# Patient Record
Sex: Female | Born: 1980
Health system: Southern US, Community
[De-identification: ages and names within clinical notes are randomized; demographics above are authoritative.]

## PROBLEM LIST (undated history)

## (undated) DIAGNOSIS — I10 Essential (primary) hypertension: Secondary | ICD-10-CM

## (undated) DIAGNOSIS — M1990 Unspecified osteoarthritis, unspecified site: Secondary | ICD-10-CM

## (undated) DIAGNOSIS — D219 Benign neoplasm of connective and other soft tissue, unspecified: Secondary | ICD-10-CM

## (undated) DIAGNOSIS — A4902 Methicillin resistant Staphylococcus aureus infection, unspecified site: Secondary | ICD-10-CM

## (undated) DIAGNOSIS — E119 Type 2 diabetes mellitus without complications: Secondary | ICD-10-CM

## (undated) HISTORY — PX: TONSILLECTOMY: SUR1361

---

## 2008-04-07 ENCOUNTER — Emergency Department (HOSPITAL_COMMUNITY): Admission: EM | Admit: 2008-04-07 | Discharge: 2008-04-07 | Payer: Self-pay | Admitting: Emergency Medicine

## 2008-06-15 ENCOUNTER — Emergency Department (HOSPITAL_COMMUNITY): Admission: EM | Admit: 2008-06-15 | Discharge: 2008-06-16 | Payer: Self-pay | Admitting: Emergency Medicine

## 2008-10-01 ENCOUNTER — Emergency Department (HOSPITAL_COMMUNITY): Admission: EM | Admit: 2008-10-01 | Discharge: 2008-10-01 | Payer: Self-pay | Admitting: Family Medicine

## 2010-04-01 ENCOUNTER — Emergency Department (HOSPITAL_COMMUNITY): Admission: EM | Admit: 2010-04-01 | Discharge: 2010-04-02 | Payer: Self-pay | Admitting: Emergency Medicine

## 2010-04-14 ENCOUNTER — Emergency Department (HOSPITAL_COMMUNITY): Admission: EM | Admit: 2010-04-14 | Discharge: 2010-04-14 | Payer: Self-pay | Admitting: Emergency Medicine

## 2010-09-28 LAB — WOUND CULTURE

## 2011-03-16 LAB — WOUND CULTURE

## 2011-06-01 ENCOUNTER — Ambulatory Visit: Payer: Self-pay

## 2011-10-23 ENCOUNTER — Emergency Department (HOSPITAL_BASED_OUTPATIENT_CLINIC_OR_DEPARTMENT_OTHER)
Admission: EM | Admit: 2011-10-23 | Discharge: 2011-10-23 | Disposition: A | Discharge status: Home / Self Care | Payer: Self-pay | Attending: Emergency Medicine | Admitting: Emergency Medicine

## 2011-10-23 ENCOUNTER — Encounter (HOSPITAL_BASED_OUTPATIENT_CLINIC_OR_DEPARTMENT_OTHER): Payer: Self-pay | Admitting: *Deleted

## 2011-10-23 DIAGNOSIS — R21 Rash and other nonspecific skin eruption: Secondary | ICD-10-CM | POA: Insufficient documentation

## 2011-10-23 DIAGNOSIS — L089 Local infection of the skin and subcutaneous tissue, unspecified: Secondary | ICD-10-CM | POA: Insufficient documentation

## 2011-10-23 HISTORY — DX: Essential (primary) hypertension: I10

## 2011-10-23 HISTORY — DX: Methicillin resistant Staphylococcus aureus infection, unspecified site: A49.02

## 2011-10-23 LAB — PREGNANCY, URINE: Preg Test, Ur: NEGATIVE

## 2011-10-23 LAB — GLUCOSE, CAPILLARY: Glucose-Capillary: 79 mg/dL (ref 70–99)

## 2011-10-23 MED ORDER — SULFAMETHOXAZOLE-TRIMETHOPRIM 800-160 MG PO TABS: 1.0000 | ORAL_TABLET | Freq: Two times a day (BID) | ORAL | Status: AC

## 2011-10-23 NOTE — Discharge Instructions (Signed)
Skin Infections A skin infection usually develops as a result of disruption of the skin barrier.  CAUSES  A skin infection might occur following:  Trauma or an injury to the skin such as a cut or insect sting.   Inflammation (as in eczema).   Breaks in the skin between the toes (as in athlete's foot).   Swelling (edema).  SYMPTOMS  The legs are the most common site affected. Usually there is:  Redness.   Swelling.   Pain.   There may be red streaks in the area of the infection.  TREATMENT   Minor skin infections may be treated with topical antibiotics, but if the skin infection is severe, hospital care and intravenous (IV) antibiotic treatment may be needed.   Most often skin infections can be treated with oral antibiotic medicine as well as proper rest and elevation of the affected area until the infection improves.   If you are prescribed oral antibiotics, it is important to take them as directed and to take all the pills even if you feel better before you have finished all of the medicine.   You may apply warm compresses to the area for 20-30 minutes 4 times daily.  You might need a tetanus shot now if:  You have no idea when you had the last one.   You have never had a tetanus shot before.   Your wound had dirt in it.  If you need a tetanus shot and you decide not to get one, there is a rare chance of getting tetanus. Sickness from tetanus can be serious. If you get a tetanus shot, your arm may swell and become red and warm at the shot site. This is common and not a problem. SEEK MEDICAL CARE IF:  The pain and swelling from your infection do not improve within 2 days.  SEEK IMMEDIATE MEDICAL CARE IF:  You develop a fever, chills, or other serious problems.  Document Released: 07/08/2004 Document Revised: 05/20/2011 Document Reviewed: 05/20/2008 Exit[employer] Patient Information 2012 [employer]. 

## 2011-10-23 NOTE — ED Provider Notes (Signed)
History/physical exam/procedure(s) were performed by non-physician practitioner and as supervising physician I was immediately available for consultation/collaboration. I have reviewed all notes and am in agreement with care and plan.   Carigan Lister S Cheyenne Bordeaux, MD 10/23/11 1928 

## 2011-10-23 NOTE — ED Provider Notes (Signed)
History     CSN: [id number]  Arrival date & time 10/23/11  1436   First MD Initiated Contact with Patient 10/23/11 1532      Chief Complaint  Patient presents with  . Rash    (Consider location/radiation/quality/duration/timing/severity/associated sxs/prior treatment) Patient is a 31 y.o. female presenting with rash. The history is provided by the patient. No language interpreter was used.  Rash  This is a new problem. The current episode started more than 1 week ago. The problem has been gradually worsening. The problem is associated with nothing. There has been no fever. The rash is present on the right lower leg and right upper leg. The pain is at a severity of 6/10. The pain is moderate. The pain has been constant since onset. Associated symptoms include blisters and itching. She has tried nothing for the symptoms.  Pt complains of an outbreak of [place]   Past Medical History  Diagnosis Date  . MRSA (methicillin resistant Staphylococcus aureus)   . Hypertension     History reviewed. No pertinent past surgical history.  History reviewed. No pertinent family history.  History  Substance Use Topics  . Smoking status: Current Everyday Smoker  . Smokeless tobacco: Not on file  . Alcohol Use: Yes    OB History    Grav Para Term Preterm Abortions TAB SAB Ect Mult Living                  Review of Systems  Skin: Positive for itching and rash.  All other systems reviewed and are negative.    Allergies  Review of patient's allergies indicates no known allergies.  Home Medications   Current Outpatient Rx  Name Route Sig Dispense Refill  . ADULT MULTIVITAMIN W/MINERALS CH Oral Take 1 tablet by mouth daily.      BP 143/86  Pulse 78  Temp(Src) 98.9 F (37.2 C) (Oral)  Resp 20  Ht 5\' 8"  (1.727 m)  Wt 225 lb (102.059 kg)  BMI 34.21 kg/m2  SpO2 99%  LMP 09/16/2011  Physical Exam  Nursing note and vitals reviewed. Constitutional: She appears well-developed and  well-nourished.  HENT:  Head: Normocephalic.  Eyes: Conjunctivae are normal. Pupils are equal, round, and reactive to light.  Neck: Normal range of motion. Neck supple.  Cardiovascular: Normal rate and normal heart sounds.   Pulmonary/Chest: Effort normal.  Abdominal: Soft.  Neurological: She is alert.  Skin: Skin is warm.    ED Course  Procedures (including critical care time)   Labs Reviewed  PREGNANCY, URINE  GLUCOSE, CAPILLARY   No results found.   No diagnosis found.    MDM  Glucose [address]        Lonia Skinner [place], [place] 10/23/11 [phone]

## 2011-10-23 NOTE — ED Notes (Signed)
Patient cbg is 25.

## 2011-10-23 NOTE — ED Notes (Signed)
Pt states she has noticed raised red area to her left side and the back of her left leg X 1 week. Hx MRSA

## 2011-12-08 ENCOUNTER — Encounter (HOSPITAL_BASED_OUTPATIENT_CLINIC_OR_DEPARTMENT_OTHER): Payer: Self-pay | Admitting: *Deleted

## 2011-12-08 ENCOUNTER — Emergency Department (HOSPITAL_BASED_OUTPATIENT_CLINIC_OR_DEPARTMENT_OTHER)
Admission: EM | Admit: 2011-12-08 | Discharge: 2011-12-08 | Disposition: A | Discharge status: Home / Self Care | Payer: Self-pay | Attending: Emergency Medicine | Admitting: Emergency Medicine

## 2011-12-08 DIAGNOSIS — F17200 Nicotine dependence, unspecified, uncomplicated: Secondary | ICD-10-CM | POA: Insufficient documentation

## 2011-12-08 DIAGNOSIS — Z8614 Personal history of Methicillin resistant Staphylococcus aureus infection: Secondary | ICD-10-CM | POA: Insufficient documentation

## 2011-12-08 DIAGNOSIS — K0889 Other specified disorders of teeth and supporting structures: Secondary | ICD-10-CM

## 2011-12-08 DIAGNOSIS — K089 Disorder of teeth and supporting structures, unspecified: Secondary | ICD-10-CM | POA: Insufficient documentation

## 2011-12-08 MED ORDER — PENICILLIN V POTASSIUM 250 MG/5ML PO SOLR: 250.0000 mg | Freq: Four times a day (QID) | ORAL | Status: AC

## 2011-12-08 MED ORDER — HYDROCODONE-IBUPROFEN 7.5-200 MG PO TABS: 1.0000 | ORAL_TABLET | Freq: Four times a day (QID) | ORAL | Status: AC | PRN

## 2011-12-08 MED ORDER — PENICILLIN V POTASSIUM 250 MG PO TABS
500.0000 mg | ORAL_TABLET | Freq: Once | ORAL | Status: AC
  Administered 2011-12-08: 500 mg via ORAL
  Filled 2011-12-08 (×2): qty 1

## 2011-12-08 MED ORDER — IBUPROFEN 400 MG PO TABS
400.0000 mg | ORAL_TABLET | Freq: Once | ORAL | Status: AC
  Administered 2011-12-08: 400 mg via ORAL
  Filled 2011-12-08: qty 1

## 2011-12-08 NOTE — Discharge Instructions (Signed)
Dental Pain A tooth ache may be caused by cavities (tooth decay). Cavities expose the nerve of the tooth to air and hot or cold temperatures. It may come from an infection or abscess (also called a boil or furuncle) around your tooth. It is also often caused by dental caries (tooth decay). This causes the pain you are having. DIAGNOSIS  Your caregiver can diagnose this problem by exam. TREATMENT   If caused by an infection, it may be treated with medications which kill germs (antibiotics) and pain medications as prescribed by your caregiver. Take medications as directed.   Only take over-the-counter or prescription medicines for pain, discomfort, or fever as directed by your caregiver.   Whether the tooth ache today is caused by infection or dental disease, you should see your dentist as soon as possible for further care.  SEEK MEDICAL CARE IF: The exam and treatment you received today has been provided on an emergency basis only. This is not a substitute for complete medical or dental care. If your problem worsens or new problems (symptoms) appear, and you are unable to meet with your dentist, call or return to this location. SEEK IMMEDIATE MEDICAL CARE IF:   You have a fever.   You develop redness and swelling of your face, jaw, or neck.   You are unable to open your mouth.   You have severe pain uncontrolled by pain medicine.  MAKE SURE YOU:   Understand these instructions.   Will watch your condition.   Will get help right away if you are not doing well or get worse.  Document Released: 05/31/2005 Document Revised: 05/20/2011 Document Reviewed: 01/17/2008 [facility] Patient Information 2012 [place], [place].    Take the meds as directed.  Take ibuprofen up to 800 mg every 8 hrs with food.  Follow up with your dentist as planned.  He may want for you to see an oral surgeon for extractions.

## 2011-12-08 NOTE — ED Notes (Addendum)
Patient states she has had right facial swelling for the last 2 days.  Thinks her wisdom teeth are growing out of her gums.  Mild swelling noted on right cheek area.  Also has sore throat and sinus congestion.

## 2011-12-08 NOTE — ED Provider Notes (Signed)
History     CSN: [id number]  Arrival date & time 12/08/11  1124   First MD Initiated Contact with Patient 12/08/11 1202      Chief Complaint  Patient presents with  . Dental Pain    (Consider location/radiation/quality/duration/timing/severity/associated sxs/prior treatment) HPI Comments: Has been having pain in wisdom teeth.  Recently the R lower has been most painful.  She expects to have dental benefits from her job in about 30 days.  Taking ibuprofen and tylenol for pain.  Pain worse in evenings and radiating to R ear.  No fever.  Patient is a 31 y.o. female presenting with tooth pain. The history is provided by the patient. No language interpreter was used.  Dental PainThe primary symptoms include mouth pain. Primary symptoms do not include dental injury or fever. Episode onset: over one month ago. The symptoms are waxing and waning. The symptoms occur intermittently.  Additional symptoms include: ear pain. Additional symptoms do not include: purulent gums, facial swelling and hearing loss. Medical issues do not include: immunosuppression.    Past Medical History  Diagnosis Date  . MRSA (methicillin resistant Staphylococcus aureus)   . Hypertension     History reviewed. No pertinent past surgical history.  No family history on file.  History  Substance Use Topics  . Smoking status: Current Everyday Smoker  . Smokeless tobacco: Not on file  . Alcohol Use: Yes    OB History    Grav Para Term Preterm Abortions TAB SAB Ect Mult Living                  Review of Systems  Constitutional: Negative for fever and chills.  HENT: Positive for ear pain and dental problem. Negative for hearing loss, facial swelling, neck pain and ear discharge.   Cardiovascular: Negative for chest pain.  All other systems reviewed and are negative.    Allergies  Review of patient's allergies indicates no known allergies.  Home Medications   Current Outpatient Rx  Name Route Sig  Dispense Refill  . ADULT MULTIVITAMIN W/MINERALS CH Oral Take 1 tablet by mouth daily.      BP 138/85  Pulse 72  Temp 98.6 F (37 C) (Oral)  Resp 20  Ht 5\' 7"  (1.702 m)  Wt 230 lb (104.327 kg)  BMI 36.02 kg/m2  SpO2 100%  LMP 11/24/2011  Physical Exam  Nursing note and vitals reviewed. Constitutional: She is oriented to person, place, and time. She appears well-developed and well-nourished. No distress.  HENT:  Head: Normocephalic and atraumatic. No trismus in the jaw.  Right Ear: External ear normal.  Left Ear: External ear normal.  Mouth/Throat: Uvula is midline and mucous membranes are normal. No dental abscesses, uvula swelling or dental caries. No oropharyngeal exudate, posterior oropharyngeal edema, posterior oropharyngeal erythema or tonsillar abscesses.    Eyes: EOM are normal.  Neck: Normal range of motion.  Cardiovascular: Normal rate, regular rhythm and normal heart sounds.   Pulmonary/Chest: Effort normal and breath sounds normal.  Abdominal: Soft. She exhibits no distension. There is no tenderness.  Musculoskeletal: Normal range of motion.  Neurological: She is alert and oriented to person, place, and time.  Skin: Skin is warm and dry.  Psychiatric: She has a normal mood and affect. Judgment normal.    ED Course  Procedures (including critical care time)  Labs Reviewed - No data to display No results found.   1. Pain, dental       MDM  No obvious abscess  rx- pen VK, 40 rx- hydrocodone, 20 OTC ibuprofen 800 mg  F/u with dentist ASAP        Evalina Field, PA 12/08/11 1229  Medical screening examination/treatment/procedure(s) were performed by non-physician practitioner and as supervising physician I was immediately available for consultation/collaboration.  Cyndra Numbers, MD 12/09/11 [phone]

## 2012-02-15 IMAGING — CR DG CHEST 2V
2 series · 2 of 2 positions shown · non-contrast
Comparison: None.

CLINICAL DATA: Chest pain

CHEST - 2 VIEW

[w chest pa]
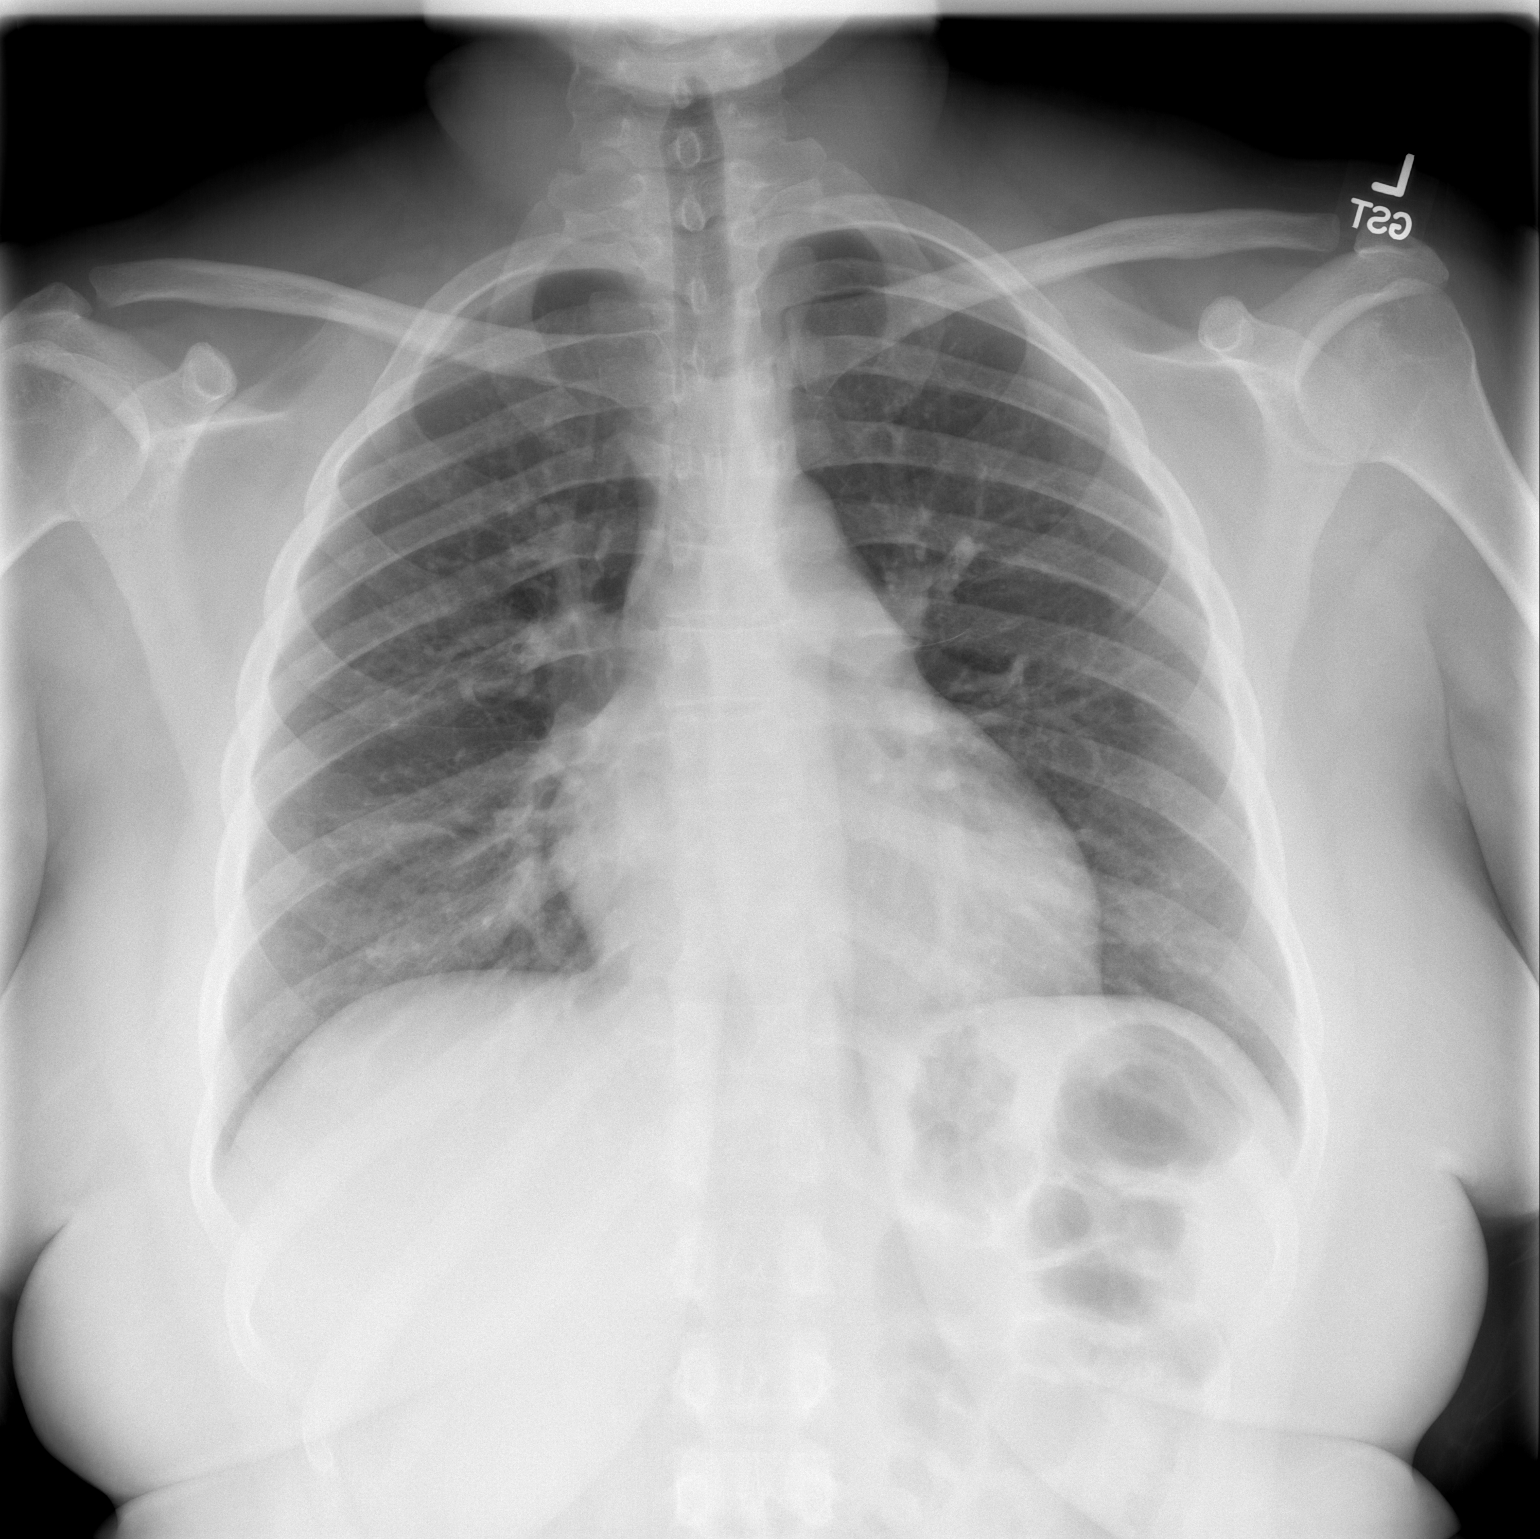

[w chest lat]
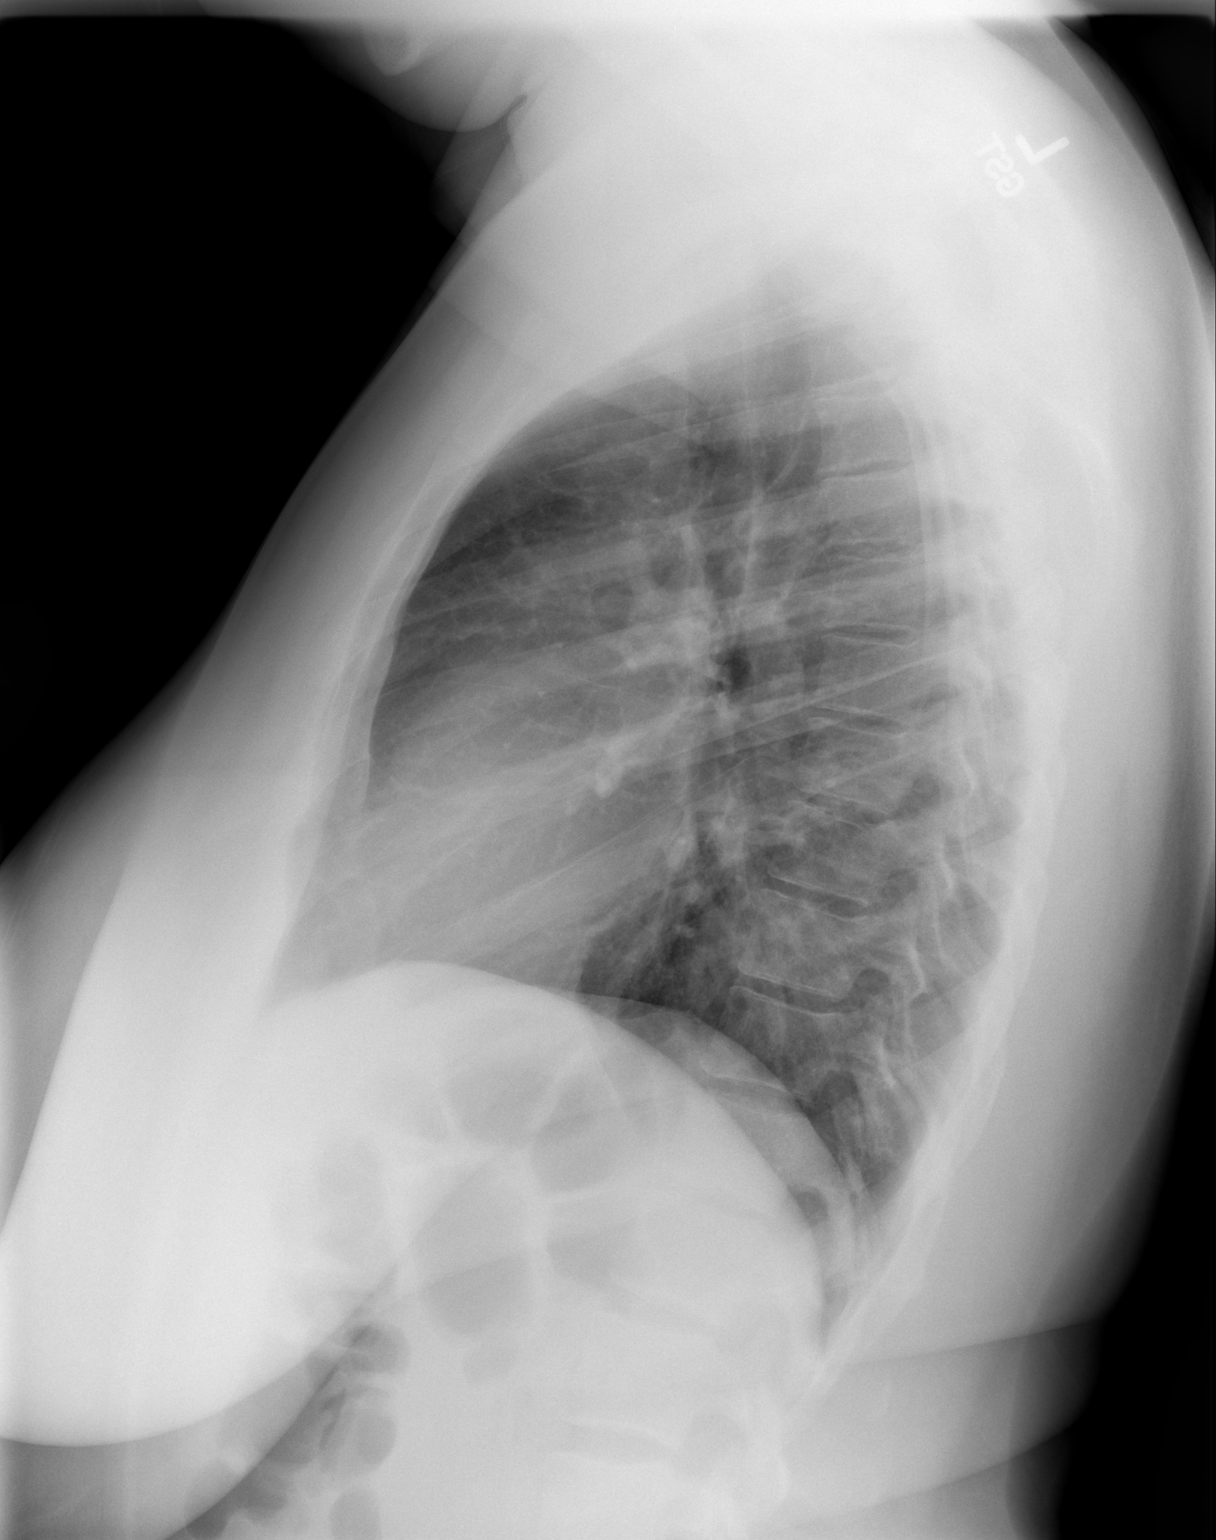

[2 of 2 positions shown; findings below may reference images not displayed]

FINDINGS: The heart size and mediastinal contours are within
normal limits.  Both lungs are clear.  The visualized skeletal
structures are unremarkable.
IMPRESSION: No active cardiopulmonary disease.

## 2012-08-02 ENCOUNTER — Encounter (HOSPITAL_BASED_OUTPATIENT_CLINIC_OR_DEPARTMENT_OTHER): Payer: Self-pay | Admitting: Family Medicine

## 2012-08-02 ENCOUNTER — Emergency Department (HOSPITAL_BASED_OUTPATIENT_CLINIC_OR_DEPARTMENT_OTHER)
Admission: EM | Admit: 2012-08-02 | Discharge: 2012-08-02 | Disposition: A | Discharge status: Home / Self Care | Payer: BC Managed Care – PPO | Attending: Emergency Medicine | Admitting: Emergency Medicine

## 2012-08-02 ENCOUNTER — Emergency Department (HOSPITAL_BASED_OUTPATIENT_CLINIC_OR_DEPARTMENT_OTHER): Payer: BC Managed Care – PPO

## 2012-08-02 DIAGNOSIS — S139XXA Sprain of joints and ligaments of unspecified parts of neck, initial encounter: Secondary | ICD-10-CM | POA: Insufficient documentation

## 2012-08-02 DIAGNOSIS — S161XXA Strain of muscle, fascia and tendon at neck level, initial encounter: Secondary | ICD-10-CM

## 2012-08-02 DIAGNOSIS — Y939 Activity, unspecified: Secondary | ICD-10-CM | POA: Insufficient documentation

## 2012-08-02 DIAGNOSIS — Y92410 Unspecified street and highway as the place of occurrence of the external cause: Secondary | ICD-10-CM | POA: Insufficient documentation

## 2012-08-02 DIAGNOSIS — R51 Headache: Secondary | ICD-10-CM | POA: Insufficient documentation

## 2012-08-02 DIAGNOSIS — I10 Essential (primary) hypertension: Secondary | ICD-10-CM | POA: Insufficient documentation

## 2012-08-02 DIAGNOSIS — Z8614 Personal history of Methicillin resistant Staphylococcus aureus infection: Secondary | ICD-10-CM | POA: Insufficient documentation

## 2012-08-02 DIAGNOSIS — F17200 Nicotine dependence, unspecified, uncomplicated: Secondary | ICD-10-CM | POA: Insufficient documentation

## 2012-08-02 NOTE — ED Notes (Signed)
Patient transported to X-ray 
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
## 2013-07-05 ENCOUNTER — Encounter (HOSPITAL_BASED_OUTPATIENT_CLINIC_OR_DEPARTMENT_OTHER): Payer: Self-pay | Admitting: Emergency Medicine

## 2013-07-05 ENCOUNTER — Emergency Department (HOSPITAL_BASED_OUTPATIENT_CLINIC_OR_DEPARTMENT_OTHER)
Admission: EM | Admit: 2013-07-05 | Discharge: 2013-07-05 | Disposition: A | Discharge status: Home / Self Care | Payer: BC Managed Care – PPO | Attending: Emergency Medicine | Admitting: Emergency Medicine

## 2013-07-05 DIAGNOSIS — B373 Candidiasis of vulva and vagina: Secondary | ICD-10-CM

## 2013-07-05 DIAGNOSIS — Z8614 Personal history of Methicillin resistant Staphylococcus aureus infection: Secondary | ICD-10-CM | POA: Insufficient documentation

## 2013-07-05 DIAGNOSIS — I10 Essential (primary) hypertension: Secondary | ICD-10-CM | POA: Insufficient documentation

## 2013-07-05 DIAGNOSIS — Z3202 Encounter for pregnancy test, result negative: Secondary | ICD-10-CM | POA: Insufficient documentation

## 2013-07-05 DIAGNOSIS — F17200 Nicotine dependence, unspecified, uncomplicated: Secondary | ICD-10-CM | POA: Insufficient documentation

## 2013-07-05 DIAGNOSIS — B3731 Acute candidiasis of vulva and vagina: Secondary | ICD-10-CM | POA: Insufficient documentation

## 2013-07-05 LAB — URINALYSIS, ROUTINE W REFLEX MICROSCOPIC
BILIRUBIN URINE: NEGATIVE
Glucose, UA: NEGATIVE mg/dL
HGB URINE DIPSTICK: NEGATIVE
Ketones, ur: NEGATIVE mg/dL
Leukocytes, UA: NEGATIVE
NITRITE: NEGATIVE
PH: 5.5 (ref 5.0–8.0)
Protein, ur: NEGATIVE mg/dL
SPECIFIC GRAVITY, URINE: 1.029 (ref 1.005–1.030)
Urobilinogen, UA: 0.2 mg/dL (ref 0.0–1.0)

## 2013-07-05 LAB — WET PREP, GENITAL: TRICH WET PREP: NONE SEEN

## 2013-07-05 LAB — PREGNANCY, URINE: PREG TEST UR: NEGATIVE

## 2013-07-05 MED ORDER — FLUCONAZOLE 50 MG PO TABS
150.0000 mg | ORAL_TABLET | Freq: Every day | ORAL | Status: DC
  Administered 2013-07-05: 150 mg via ORAL
  Filled 2013-07-05 (×2): qty 1

## 2013-07-05 MED ORDER — FLUCONAZOLE 200 MG PO TABS: 200.0000 mg | ORAL_TABLET | Freq: Every day | ORAL | Status: AC

## 2013-07-05 NOTE — ED Notes (Signed)
Pt now also states she is having foul smelling vaginal d/c x 3-4 days

## 2013-07-05 NOTE — Discharge Instructions (Signed)
Candidal Vulvovaginitis  Candidal vulvovaginitis is an infection of the vagina and vulva. The vulva is the skin around the opening of the vagina. This may cause itching and discomfort in and around the vagina.   HOME CARE  · Only take medicine as told by your doctor.  · Do not have sex (intercourse) until the infection is healed or as told by your doctor.  · Practice safe sex.  · Tell your sex partner about your infection.  · Do not douche or use tampons.  · Wear cotton underwear. Do not wear tight pants or panty hose.  · Eat yogurt. This may help treat and prevent yeast infections.  GET HELP RIGHT AWAY IF:   · You have a fever.  · Your problems get worse during treatment or do not get better in 3 days.  · You have discomfort, irritation, or itching in your vagina or vulva area.  · You have pain after sex.  · You start to get belly (abdominal) pain.  MAKE SURE YOU:  · Understand these instructions.  · Will watch your condition.  · Will get help right away if you are not doing well or get worse.  Document Released: 08/27/2008 Document Revised: 08/23/2011 Document Reviewed: 08/27/2008  [employer]® Patient Information ©2014 [employer].

## 2013-07-05 NOTE — ED Notes (Signed)
abd pain, diarrhea x 1 week-fast paced steady gait into triage-NAD

## 2013-07-05 NOTE — ED Provider Notes (Addendum)
CSN: [id number]     Arrival date & time 07/05/13  2011 History  This chart was scribed for Gwyneth SproutWhitney Javelle Donigan, MD by Dorothey Basemania Sutton, ED Scribe. This patient was seen in room MH06/MH06 and the patient's care was started at 9:01 PM.    Chief Complaint  Patient presents with  . Abdominal Pain   The history is provided by the patient. No language interpreter was used.   HPI Comments: Adrienne Roberts is a 33 y.o. female who presents to the Emergency Department complaining of an intermittent, pressure-like pain to the suprapubic region of the abdomen that is exacerbated with urination onset about a week ago. Patient denies any pain radiation into the back. She reports an associated vaginal discomfort, described as an itching sensation, with malodorous, white, mucus-like discharge onset about 3-4 days ago. She states that she is currently sexually active (same partner for 4 years), but does not use protection or birth control. She states her LMP was on 06/07/2013. She denies dysuria, fever, emesis. Patient has a history of HTN and MRSA.   Past Medical History  Diagnosis Date  . MRSA (methicillin resistant Staphylococcus aureus)   . Hypertension    History reviewed. No pertinent past surgical history. No family history on file. History  Substance Use Topics  . Smoking status: Current Every Day Smoker  . Smokeless tobacco: Not on file  . Alcohol Use: Yes   OB History   Grav Para Term Preterm Abortions TAB SAB Ect Mult Living                 Review of Systems  A complete 10 system review of systems was obtained and all systems are negative except as noted in the HPI and PMH.   Allergies  Review of patient's allergies indicates no known allergies.  Home Medications   Current Outpatient Rx  Name  Route  Sig  Dispense  Refill  . Multiple Vitamin (MULITIVITAMIN WITH MINERALS) TABS   Oral   Take 1 tablet by mouth daily.          Triage Vitals: BP 147/90  Pulse 98  Temp(Src) 98.3 F (36.8  C) (Oral)  Resp 16  Ht 5' 7.5" (1.715 m)  Wt 240 lb (108.863 kg)  BMI 37.01 kg/m2  SpO2 98%  LMP 06/05/2013  Physical Exam  Nursing note and vitals reviewed. Constitutional: She is oriented to person, place, and time. She appears well-developed and well-nourished. No distress.  HENT:  Head: Normocephalic and atraumatic.  Eyes: Conjunctivae are normal.  Neck: Normal range of motion. Neck supple.  Cardiovascular: Normal rate, regular rhythm and normal heart sounds.   Pulmonary/Chest: Effort normal and breath sounds normal. No respiratory distress.  Abdominal: Soft. Bowel sounds are normal. She exhibits no distension and no mass. There is tenderness. There is no rebound and no guarding.  Mild tenderness to palpation to the suprapubic region. No CVA tenderness.   Genitourinary: Vaginal discharge found.  White, mucus-like discharge from the vaginal vault. Vaginal mucosa is irritated. Diffuse cervical and adnexal tenderness without masses. No cervical motion tenderness.   Musculoskeletal: Normal range of motion.  Neurological: She is alert and oriented to person, place, and time.  Skin: Skin is warm and dry.  Psychiatric: She has a normal mood and affect. Her behavior is normal.    ED Course  Procedures (including critical care time)  DIAGNOSTIC STUDIES: Oxygen Saturation is 98% on room air, normal by my interpretation.    COORDINATION OF CARE: 9:05  PM- Discussed that UA was normal. Ordered STD testing. Discussed treatment plan with patient at bedside and patient verbalized agreement.     Labs Review Labs Reviewed  WET PREP, GENITAL - Abnormal; Notable for the following:    Yeast Wet Prep HPF POC FEW (*)    Clue Cells Wet Prep HPF POC MODERATE (*)    WBC, Wet Prep HPF POC FEW (*)    All other components within normal limits  GC/CHLAMYDIA PROBE AMP  URINALYSIS, ROUTINE W REFLEX MICROSCOPIC  PREGNANCY, URINE   Imaging Review No results found.  EKG Interpretation   None        MDM   1. Vaginal candida     Patient with a history of vaginal discharge and no abdominal pain. She denies any dysuria and has at normal UA and negative UPT. On exam she has some mild vaginal discharge and irritation in the vaginal vault. No exam findings concerning for ovarian torsion, tubo-ovarian abscess or PID. Wet prep and GC/chlamydia pending  9:46 PM Evidence of yeast and BV but no other significant findings.  Will treat for yeast and wait for cultures.  I personally performed the services described in this documentation, which was scribed in my presence.  The recorded information has been reviewed and considered.     Gwyneth Sprout, MD 07/05/13 9811  Gwyneth Sprout, MD 07/05/13 2147

## 2013-07-06 LAB — GC/CHLAMYDIA PROBE AMP
CT PROBE, AMP APTIMA: NEGATIVE
GC Probe RNA: NEGATIVE

## 2014-01-28 ENCOUNTER — Emergency Department (HOSPITAL_BASED_OUTPATIENT_CLINIC_OR_DEPARTMENT_OTHER)
Admission: EM | Admit: 2014-01-28 | Discharge: 2014-01-28 | Disposition: A | Discharge status: Home / Self Care | Payer: BC Managed Care – PPO | Attending: Emergency Medicine | Admitting: Emergency Medicine

## 2014-01-28 ENCOUNTER — Encounter (HOSPITAL_BASED_OUTPATIENT_CLINIC_OR_DEPARTMENT_OTHER): Payer: Self-pay | Admitting: Emergency Medicine

## 2014-01-28 DIAGNOSIS — A499 Bacterial infection, unspecified: Secondary | ICD-10-CM | POA: Insufficient documentation

## 2014-01-28 DIAGNOSIS — Z3202 Encounter for pregnancy test, result negative: Secondary | ICD-10-CM | POA: Insufficient documentation

## 2014-01-28 DIAGNOSIS — F17200 Nicotine dependence, unspecified, uncomplicated: Secondary | ICD-10-CM | POA: Insufficient documentation

## 2014-01-28 DIAGNOSIS — N898 Other specified noninflammatory disorders of vagina: Secondary | ICD-10-CM | POA: Insufficient documentation

## 2014-01-28 DIAGNOSIS — B9689 Other specified bacterial agents as the cause of diseases classified elsewhere: Secondary | ICD-10-CM | POA: Insufficient documentation

## 2014-01-28 DIAGNOSIS — Z8614 Personal history of Methicillin resistant Staphylococcus aureus infection: Secondary | ICD-10-CM | POA: Insufficient documentation

## 2014-01-28 DIAGNOSIS — B3731 Acute candidiasis of vulva and vagina: Secondary | ICD-10-CM | POA: Insufficient documentation

## 2014-01-28 DIAGNOSIS — N760 Acute vaginitis: Secondary | ICD-10-CM | POA: Insufficient documentation

## 2014-01-28 DIAGNOSIS — B373 Candidiasis of vulva and vagina: Secondary | ICD-10-CM

## 2014-01-28 DIAGNOSIS — I10 Essential (primary) hypertension: Secondary | ICD-10-CM | POA: Insufficient documentation

## 2014-01-28 LAB — WET PREP, GENITAL
Clue Cells Wet Prep HPF POC: NONE SEEN
TRICH WET PREP: NONE SEEN

## 2014-01-28 LAB — URINE MICROSCOPIC-ADD ON

## 2014-01-28 LAB — URINALYSIS, ROUTINE W REFLEX MICROSCOPIC
BILIRUBIN URINE: NEGATIVE
GLUCOSE, UA: NEGATIVE mg/dL
HGB URINE DIPSTICK: NEGATIVE
Ketones, ur: NEGATIVE mg/dL
Nitrite: NEGATIVE
Protein, ur: NEGATIVE mg/dL
SPECIFIC GRAVITY, URINE: 1.022 (ref 1.005–1.030)
UROBILINOGEN UA: 0.2 mg/dL (ref 0.0–1.0)
pH: 5.5 (ref 5.0–8.0)

## 2014-01-28 LAB — CBG MONITORING, ED: GLUCOSE-CAPILLARY: 77 mg/dL (ref 70–99)

## 2014-01-28 LAB — PREGNANCY, URINE: Preg Test, Ur: NEGATIVE

## 2014-01-28 MED ORDER — FLUCONAZOLE 150 MG PO TABS: 150.0000 mg | ORAL_TABLET | Freq: Every day | ORAL | Status: DC

## 2014-01-28 MED ORDER — METRONIDAZOLE 500 MG PO TABS: 500.0000 mg | ORAL_TABLET | Freq: Two times a day (BID) | ORAL | Status: DC

## 2014-01-28 NOTE — ED Notes (Signed)
Pelvic setup at bedside.

## 2014-01-28 NOTE — ED Notes (Signed)
Patient states she has vaginal discharge and itching for the last two days.  Describes discharge as "cottage cheese" in texture and thick clear mucus.  States she was seen here in Jan 2015 for same and that the symptoms have never completely cleared, comes and goes.

## 2014-01-28 NOTE — ED Notes (Signed)
Provider at bedside for pt eval.

## 2014-01-28 NOTE — Discharge Instructions (Signed)
Bacterial Vaginosis Bacterial vaginosis is a vaginal infection that occurs when the normal balance of bacteria in the vagina is disrupted. It results from an overgrowth of certain bacteria. This is the most common vaginal infection in women of childbearing age. Treatment is important to prevent complications, especially in pregnant women, as it can cause a premature delivery. CAUSES  Bacterial vaginosis is caused by an increase in harmful bacteria that are normally present in smaller amounts in the vagina. Several different kinds of bacteria can cause bacterial vaginosis. However, the reason that the condition develops is not fully understood. RISK FACTORS Certain activities or behaviors can put you at an increased risk of developing bacterial vaginosis, including:  Having a new sex partner or multiple sex partners.  Douching.  Using an intrauterine device (IUD) for contraception. Women do not get bacterial vaginosis from toilet seats, bedding, swimming pools, or contact with objects around them. SIGNS AND SYMPTOMS  Some women with bacterial vaginosis have no signs or symptoms. Common symptoms include:  Grey vaginal discharge.  A fishlike odor with discharge, especially after sexual intercourse.  Itching or burning of the vagina and vulva.  Burning or pain with urination. DIAGNOSIS  Your health care provider will take a medical history and examine the vagina for signs of bacterial vaginosis. A sample of vaginal fluid may be taken. Your health care provider will look at this sample under a microscope to check for bacteria and abnormal cells. A vaginal pH test may also be done.  TREATMENT  Bacterial vaginosis may be treated with antibiotic medicines. These may be given in the form of a pill or a vaginal cream. A second round of antibiotics may be prescribed if the condition comes back after treatment.  HOME CARE INSTRUCTIONS   Only take over-the-counter or prescription medicines as  directed by your health care provider.  If antibiotic medicine was prescribed, take it as directed. Make sure you finish it even if you start to feel better.  Do not have sex until treatment is completed.  Tell all sexual partners that you have a vaginal infection. They should see their health care provider and be treated if they have problems, such as a mild rash or itching.  Practice safe sex by using condoms and only having one sex partner. SEEK MEDICAL CARE IF:   Your symptoms are not improving after 3 days of treatment.  You have increased discharge or pain.  You have a fever. MAKE SURE YOU:   Understand these instructions.  Will watch your condition.  Will get help right away if you are not doing well or get worse. FOR MORE INFORMATION  Centers for Disease Control and Prevention, Division of STD Prevention: www.cdc.gov/std American Sexual Health Association (ASHA): www.ashastd.org  Document Released: 05/31/2005 Document Revised: 03/21/2013 Document Reviewed: 01/10/2013 ExitCare Patient Information 2015 [employer]. This information is not intended to replace advice given to you by your health care provider. Make sure you discuss any questions you have with your health care provider. Monilial Vaginitis Vaginitis in a soreness, swelling and redness (inflammation) of the vagina and vulva. Monilial vaginitis is not a sexually transmitted infection. CAUSES  Yeast vaginitis is caused by yeast (candida) that is normally found in your vagina. With a yeast infection, the candida has overgrown in number to a point that upsets the chemical balance. SYMPTOMS   White, thick vaginal discharge.  Swelling, itching, redness and irritation of the vagina and possibly the lips of the vagina (vulva).  Burning or painful   urination.  Painful intercourse. DIAGNOSIS  Things that may contribute to monilial vaginitis are:  Postmenopausal and virginal  states.  Pregnancy.  Infections.  Being tired, sick or stressed, especially if you had monilial vaginitis in the past.  Diabetes. Good control will help lower the chance.  Birth control pills.  Tight fitting garments.  Using bubble bath, feminine sprays, douches or deodorant tampons.  Taking certain medications that kill germs (antibiotics).  Sporadic recurrence can occur if you become ill. TREATMENT  Your caregiver will give you medication.  There are several kinds of anti monilial vaginal creams and suppositories specific for monilial vaginitis. For recurrent yeast infections, use a suppository or cream in the vagina 2 times a week, or as directed.  Anti-monilial or steroid cream for the itching or irritation of the vulva may also be used. Get your caregiver's permission.  Painting the vagina with methylene blue solution may help if the monilial cream does not work.  Eating yogurt may help prevent monilial vaginitis. HOME CARE INSTRUCTIONS   Finish all medication as prescribed.  Do not have sex until treatment is completed or after your caregiver tells you it is okay.  Take warm sitz baths.  Do not douche.  Do not use tampons, especially scented ones.  Wear cotton underwear.  Avoid tight pants and panty hose.  Tell your sexual partner that you have a yeast infection. They should go to their caregiver if they have symptoms such as mild rash or itching.  Your sexual partner should be treated as well if your infection is difficult to eliminate.  Practice safer sex. Use condoms.  Some vaginal medications cause latex condoms to fail. Vaginal medications that harm condoms are:  Cleocin cream.  Butoconazole (Femstat).  Terconazole (Terazol) vaginal suppository.  Miconazole (Monistat) (may be purchased over the counter). SEEK MEDICAL CARE IF:   You have a temperature by mouth above 102 F (38.9 C).  The infection is getting worse after 2 days of  treatment.  The infection is not getting better after 3 days of treatment.  You develop blisters in or around your vagina.  You develop vaginal bleeding, and it is not your menstrual period.  You have pain when you urinate.  You develop intestinal problems.  You have pain with sexual intercourse. Document Released: 03/10/2005 Document Revised: 08/23/2011 Document Reviewed: 11/22/2008 ExitCare Patient Information 2015 [employer]. This information is not intended to replace advice given to you by your health care provider. Make sure you discuss any questions you have with your health care provider.  

## 2014-01-28 NOTE — ED Provider Notes (Signed)
CSN: [id number]     Arrival date & time 01/28/14  1226 History   First MD Initiated Contact with Patient 01/28/14 1254     No chief complaint on file.    (Consider location/radiation/quality/duration/timing/severity/associated sxs/prior Treatment) Patient is a 33 y.o. female presenting with vaginal discharge. The history is provided by the patient. No language interpreter was used.  Vaginal Discharge Quality:  White and thick Severity:  Moderate Onset quality:  Gradual Duration:  2 days Timing:  Constant Progression:  Worsening Chronicity:  New Relieved by:  Nothing Worsened by:  Nothing tried Ineffective treatments:  None tried Associated symptoms: vaginal itching   Associated symptoms: no nausea and no vomiting   Risk factors: no STI exposure     Past Medical History  Diagnosis Date  . MRSA (methicillin resistant Staphylococcus aureus)   . Hypertension    History reviewed. No pertinent past surgical history. No family history on file. History  Substance Use Topics  . Smoking status: Current Every Day Smoker  . Smokeless tobacco: Not on file  . Alcohol Use: Yes   OB History   Grav Para Term Preterm Abortions TAB SAB Ect Mult Living                 Review of Systems  Gastrointestinal: Negative for nausea and vomiting.  Genitourinary: Positive for vaginal discharge.  All other systems reviewed and are negative.     Allergies  Review of patient's allergies indicates no known allergies.  Home Medications   Prior to Admission medications   Medication Sig Start Date End Date Taking? Authorizing Provider  Multiple Vitamin (MULITIVITAMIN WITH MINERALS) TABS Take 1 tablet by mouth daily.    Historical Provider, MD   BP 143/92  Pulse 78  Temp(Src) 98.4 F (36.9 C) (Oral)  Resp 20  Ht 5\' 8"  (1.727 m)  Wt 245 lb (111.131 kg)  BMI 37.26 kg/m2  SpO2 100%  LMP 01/14/2014 Physical Exam  Nursing note and vitals reviewed. Constitutional: She is oriented to  person, place, and time. She appears well-developed and well-nourished.  HENT:  Head: Normocephalic and atraumatic.  Eyes: Conjunctivae and EOM are normal. Pupils are equal, round, and reactive to light.  Neck: Normal range of motion. Neck supple.  Cardiovascular: Normal rate.   Pulmonary/Chest: Effort normal.  Abdominal: Soft. She exhibits no distension. There is no tenderness.  Genitourinary: Vaginal discharge found.  Vaginal discharge,  Thick white,  Adnexa no masses,  Cervix nontender  Musculoskeletal: Normal range of motion.  Neurological: She is alert and oriented to person, place, and time.  Skin: Skin is warm.  Psychiatric: She has a normal mood and affect.    ED Course  Procedures (including critical care time) Labs Review Labs Reviewed  WET PREP, GENITAL - Abnormal; Notable for the following:    Yeast Wet Prep HPF POC MODERATE (*)    WBC, Wet Prep HPF POC MODERATE (*)    All other components within normal limits  URINALYSIS, ROUTINE W REFLEX MICROSCOPIC - Abnormal; Notable for the following:    Leukocytes, UA MODERATE (*)    All other components within normal limits  URINE MICROSCOPIC-ADD ON - Abnormal; Notable for the following:    Squamous Epithelial / LPF FEW (*)    Bacteria, UA MANY (*)    All other components within normal limits  GC/CHLAMYDIA PROBE AMP  PREGNANCY, URINE  CBG MONITORING, ED    Imaging Review No results found.   EKG Interpretation None  MDM   Final diagnoses:  BV (bacterial vaginosis)  Yeast vaginitis    Diflucan flagyl    Elson Areas, PA-C 01/28/14 1421

## 2014-01-29 LAB — GC/CHLAMYDIA PROBE AMP
CT PROBE, AMP APTIMA: NEGATIVE
GC PROBE AMP APTIMA: NEGATIVE

## 2014-01-30 NOTE — ED Provider Notes (Signed)
Medical screening examination/treatment/procedure(s) were performed by non-physician practitioner and as supervising physician I was immediately available for consultation/collaboration.   EKG Interpretation None       Vanetta MuldersScott Elain Wixon, MD 01/30/14 1506

## 2014-07-19 ENCOUNTER — Encounter (HOSPITAL_BASED_OUTPATIENT_CLINIC_OR_DEPARTMENT_OTHER): Payer: Self-pay | Admitting: Emergency Medicine

## 2014-07-19 ENCOUNTER — Emergency Department (HOSPITAL_BASED_OUTPATIENT_CLINIC_OR_DEPARTMENT_OTHER)
Admission: EM | Admit: 2014-07-19 | Discharge: 2014-07-19 | Disposition: A | Discharge status: Home / Self Care | Payer: Managed Care, Other (non HMO) | Attending: Emergency Medicine | Admitting: Emergency Medicine

## 2014-07-19 DIAGNOSIS — Z8614 Personal history of Methicillin resistant Staphylococcus aureus infection: Secondary | ICD-10-CM | POA: Insufficient documentation

## 2014-07-19 DIAGNOSIS — I10 Essential (primary) hypertension: Secondary | ICD-10-CM | POA: Diagnosis not present

## 2014-07-19 DIAGNOSIS — J029 Acute pharyngitis, unspecified: Secondary | ICD-10-CM | POA: Insufficient documentation

## 2014-07-19 DIAGNOSIS — J3489 Other specified disorders of nose and nasal sinuses: Secondary | ICD-10-CM | POA: Diagnosis not present

## 2014-07-19 DIAGNOSIS — Z79899 Other long term (current) drug therapy: Secondary | ICD-10-CM | POA: Diagnosis not present

## 2014-07-19 DIAGNOSIS — Z720 Tobacco use: Secondary | ICD-10-CM | POA: Diagnosis not present

## 2014-07-19 DIAGNOSIS — Z792 Long term (current) use of antibiotics: Secondary | ICD-10-CM | POA: Insufficient documentation

## 2014-07-19 LAB — RAPID STREP SCREEN (MED CTR MEBANE ONLY): Streptococcus, Group A Screen (Direct): NEGATIVE

## 2014-07-19 NOTE — ED Notes (Signed)
Patient states that she has had a sore throat x 2 days.  

## 2014-07-19 NOTE — Discharge Instructions (Signed)

## 2014-07-19 NOTE — ED Provider Notes (Signed)
CSN: [id number]     Arrival date & time 07/19/14  2237 History   First MD Initiated Contact with Patient 07/19/14 2342     Chief Complaint  Patient presents with  . Sore Throat     (Consider location/radiation/quality/duration/timing/severity/associated sxs/prior Treatment) HPI Comments: Patient presents with a sore throat. She states it started about 2 days ago is gradually got worse since that time. She states it hurts to swallow. She's had a little bit of a runny nose and congestion. She states the pain is worse in the morning and gets better through the day. She denies any fevers. There is no nausea or vomiting. She denies any difficulty controlling her secretions. She's been using over-the-counter medicines without relief.  Patient is a 34 y.o. female presenting with pharyngitis.  Sore Throat Pertinent negatives include no chest pain, no abdominal pain, no headaches and no shortness of breath.    Past Medical History  Diagnosis Date  . MRSA (methicillin resistant Staphylococcus aureus)   . Hypertension    History reviewed. No pertinent past surgical history. History reviewed. No pertinent family history. History  Substance Use Topics  . Smoking status: Current Every Day Smoker  . Smokeless tobacco: Not on file  . Alcohol Use: Yes   OB History    No data available     Review of Systems  Constitutional: Negative for fever, chills, diaphoresis and fatigue.  HENT: Positive for postnasal drip, rhinorrhea and sore throat. Negative for congestion and sneezing.   Eyes: Negative.   Respiratory: Negative for cough, chest tightness and shortness of breath.   Cardiovascular: Negative for chest pain and leg swelling.  Gastrointestinal: Negative for nausea, vomiting, abdominal pain, diarrhea and blood in stool.  Genitourinary: Negative for frequency, hematuria, flank pain and difficulty urinating.  Musculoskeletal: Negative for back pain and arthralgias.  Skin: Negative for rash.   Neurological: Negative for dizziness, speech difficulty, weakness, numbness and headaches.      Allergies  Review of patient's allergies indicates no known allergies.  Home Medications   Prior to Admission medications   Medication Sig Start Date End Date Taking? Authorizing Provider  fluconazole (DIFLUCAN) 150 MG tablet Take 1 tablet (150 mg total) by mouth daily. 01/28/14   Elson AreasLeslie K Sofia, PA-C  metroNIDAZOLE (FLAGYL) 500 MG tablet Take 1 tablet (500 mg total) by mouth 2 (two) times daily. 01/28/14   Elson AreasLeslie K Sofia, PA-C  Multiple Vitamin (MULITIVITAMIN WITH MINERALS) TABS Take 1 tablet by mouth daily.    Historical Provider, MD   BP 131/80 mmHg  Pulse 88  Temp(Src) 97.8 F (36.6 C) (Oral)  Resp 18  Ht 5\' 8"  (1.727 m)  Wt 235 lb (106.595 kg)  BMI 35.74 kg/m2  SpO2 100%  LMP 06/24/2014 Physical Exam  Constitutional: She is oriented to person, place, and time. She appears well-developed and well-nourished.  HENT:  Head: Normocephalic and atraumatic.  Mouth/Throat: Oropharynx is clear and moist. No oropharyngeal exudate.  Uvula is midline, no trismus  Eyes: Pupils are equal, round, and reactive to light.  Neck: Normal range of motion. Neck supple.  Cardiovascular: Normal rate, regular rhythm and normal heart sounds.   Pulmonary/Chest: Effort normal and breath sounds normal. No respiratory distress. She has no wheezes. She has no rales. She exhibits no tenderness.  Abdominal: Soft. Bowel sounds are normal. There is no tenderness. There is no rebound and no guarding.  Musculoskeletal: Normal range of motion. She exhibits no edema.  Lymphadenopathy:    She has cervical  adenopathy (minor).  Neurological: She is alert and oriented to person, place, and time.  Skin: Skin is warm and dry. No rash noted.  Psychiatric: She has a normal mood and affect.    ED Course  Procedures (including critical care time) Labs Review Labs Reviewed  RAPID STREP SCREEN  CULTURE, GROUP A STREP     Imaging Review No results found.   EKG Interpretation None      MDM   Final diagnoses:  Pharyngitis    Patient with pharyngitis, likely viral. She was instructed in symptomatic care and return precautions were given.    Rolan Bucco, MD 07/19/14 [phone]

## 2014-07-22 LAB — CULTURE, GROUP A STREP

## 2014-07-27 ENCOUNTER — Emergency Department (HOSPITAL_BASED_OUTPATIENT_CLINIC_OR_DEPARTMENT_OTHER)
Admission: EM | Admit: 2014-07-27 | Discharge: 2014-07-27 | Disposition: A | Discharge status: Home / Self Care | Payer: Managed Care, Other (non HMO) | Attending: Emergency Medicine | Admitting: Emergency Medicine

## 2014-07-27 ENCOUNTER — Encounter (HOSPITAL_BASED_OUTPATIENT_CLINIC_OR_DEPARTMENT_OTHER): Payer: Self-pay

## 2014-07-27 DIAGNOSIS — Z720 Tobacco use: Secondary | ICD-10-CM | POA: Insufficient documentation

## 2014-07-27 DIAGNOSIS — H9209 Otalgia, unspecified ear: Secondary | ICD-10-CM | POA: Insufficient documentation

## 2014-07-27 DIAGNOSIS — J36 Peritonsillar abscess: Secondary | ICD-10-CM | POA: Diagnosis not present

## 2014-07-27 DIAGNOSIS — Z79899 Other long term (current) drug therapy: Secondary | ICD-10-CM | POA: Insufficient documentation

## 2014-07-27 DIAGNOSIS — J029 Acute pharyngitis, unspecified: Secondary | ICD-10-CM | POA: Diagnosis present

## 2014-07-27 DIAGNOSIS — J028 Acute pharyngitis due to other specified organisms: Secondary | ICD-10-CM

## 2014-07-27 DIAGNOSIS — M542 Cervicalgia: Secondary | ICD-10-CM | POA: Diagnosis not present

## 2014-07-27 DIAGNOSIS — Z8614 Personal history of Methicillin resistant Staphylococcus aureus infection: Secondary | ICD-10-CM | POA: Insufficient documentation

## 2014-07-27 DIAGNOSIS — I10 Essential (primary) hypertension: Secondary | ICD-10-CM | POA: Insufficient documentation

## 2014-07-27 LAB — CBC WITH DIFFERENTIAL/PLATELET
BASOS PCT: 0 % (ref 0–1)
Basophils Absolute: 0 10*3/uL (ref 0.0–0.1)
EOS ABS: 0.2 10*3/uL (ref 0.0–0.7)
EOS PCT: 2 % (ref 0–5)
HCT: 34.4 % — ABNORMAL LOW (ref 36.0–46.0)
HEMOGLOBIN: 11.8 g/dL — AB (ref 12.0–15.0)
LYMPHS ABS: 2.2 10*3/uL (ref 0.7–4.0)
Lymphocytes Relative: 23 % (ref 12–46)
MCH: 25.8 pg — AB (ref 26.0–34.0)
MCHC: 34.3 g/dL (ref 30.0–36.0)
MCV: 75.1 fL — AB (ref 78.0–100.0)
MONO ABS: 0.9 10*3/uL (ref 0.1–1.0)
MONOS PCT: 9 % (ref 3–12)
NEUTROS PCT: 66 % (ref 43–77)
Neutro Abs: 6.3 10*3/uL (ref 1.7–7.7)
Platelets: 353 10*3/uL (ref 150–400)
RBC: 4.58 MIL/uL (ref 3.87–5.11)
RDW: 14.8 % (ref 11.5–15.5)
WBC: 9.6 10*3/uL (ref 4.0–10.5)

## 2014-07-27 LAB — BASIC METABOLIC PANEL
Anion gap: 3 — ABNORMAL LOW (ref 5–15)
BUN: 10 mg/dL (ref 6–23)
CALCIUM: 8.2 mg/dL — AB (ref 8.4–10.5)
CO2: 25 mmol/L (ref 19–32)
CREATININE: 0.8 mg/dL (ref 0.50–1.10)
Chloride: 109 mmol/L (ref 96–112)
GLUCOSE: 101 mg/dL — AB (ref 70–99)
Potassium: 3.8 mmol/L (ref 3.5–5.1)
Sodium: 137 mmol/L (ref 135–145)

## 2014-07-27 MED ORDER — LIDOCAINE HCL (PF) 1 % IJ SOLN
INTRAMUSCULAR | Status: AC
  Administered 2014-07-27: 2 mL via RESPIRATORY_TRACT
  Filled 2014-07-27: qty 5

## 2014-07-27 MED ORDER — MORPHINE SULFATE 4 MG/ML IJ SOLN
4.0000 mg | Freq: Once | INTRAMUSCULAR | Status: AC
  Administered 2014-07-27: 4 mg via INTRAVENOUS
  Filled 2014-07-27: qty 1

## 2014-07-27 MED ORDER — SODIUM CHLORIDE 0.9 % IV BOLUS (SEPSIS)
1000.0000 mL | Freq: Once | INTRAVENOUS | Status: AC
  Administered 2014-07-27: 1000 mL via INTRAVENOUS

## 2014-07-27 MED ORDER — SODIUM CHLORIDE 0.9 % IV SOLN
3.0000 g | Freq: Once | INTRAVENOUS | Status: AC
  Administered 2014-07-27: 3 g via INTRAVENOUS
  Filled 2014-07-27: qty 3

## 2014-07-27 MED ORDER — HYDROCODONE-ACETAMINOPHEN 5-325 MG PO TABS: 2.0000 | ORAL_TABLET | ORAL | Status: DC | PRN

## 2014-07-27 MED ORDER — NAPROXEN 500 MG PO TABS: 500.0000 mg | ORAL_TABLET | Freq: Two times a day (BID) | ORAL | Status: DC | PRN

## 2014-07-27 MED ORDER — AMOXICILLIN-POT CLAVULANATE 875-125 MG PO TABS: 1.0000 | ORAL_TABLET | Freq: Two times a day (BID) | ORAL | Status: DC

## 2014-07-27 MED ORDER — DEXAMETHASONE SODIUM PHOSPHATE 10 MG/ML IJ SOLN
10.0000 mg | Freq: Once | INTRAMUSCULAR | Status: AC
  Administered 2014-07-27: 10 mg via INTRAVENOUS
  Filled 2014-07-27: qty 1

## 2014-07-27 MED ORDER — LIDOCAINE HCL (PF) 2 % IJ SOLN: 2.0000 mL | Freq: Once | INTRAMUSCULAR | Status: DC

## 2014-07-27 MED ORDER — KETOROLAC TROMETHAMINE 15 MG/ML IJ SOLN
15.0000 mg | Freq: Once | INTRAMUSCULAR | Status: AC
  Administered 2014-07-27: 15 mg via INTRAVENOUS
  Filled 2014-07-27: qty 1

## 2014-07-27 NOTE — ED Provider Notes (Signed)
CSN: [id number]     Arrival date & time 07/27/14  6213 History   First MD Initiated Contact with Patient 07/27/14 1000     Chief Complaint  Patient presents with  . sore throat and neck pain      (Consider location/radiation/quality/duration/timing/severity/associated sxs/prior Treatment) HPI Comments: 34 year old female with no significant medical history, smoker presents with worsening sore throat on the left side and swelling sensation for the past 2 weeks. Patient was seen and diagnosed with pharyngitis. Symptoms worsened since. Painful to swallow, no breathing difficulties however swelling sensation in the back of the throat on the left worsening.  The history is provided by the patient.    Past Medical History  Diagnosis Date  . MRSA (methicillin resistant Staphylococcus aureus)   . Hypertension    History reviewed. No pertinent past surgical history. No family history on file. History  Substance Use Topics  . Smoking status: Current Every Day Smoker  . Smokeless tobacco: Not on file  . Alcohol Use: Yes   OB History    No data available     Review of Systems  Constitutional: Positive for fever. Negative for chills.  HENT: Positive for congestion, ear pain and sore throat.   Eyes: Negative for visual disturbance.  Respiratory: Negative for shortness of breath.   Cardiovascular: Negative for chest pain.  Gastrointestinal: Negative for vomiting and abdominal pain.  Genitourinary: Negative for dysuria and flank pain.  Musculoskeletal: Positive for neck pain (left side). Negative for back pain and neck stiffness.  Skin: Negative for rash.  Neurological: Negative for light-headedness and headaches.      Allergies  Review of patient's allergies indicates no known allergies.  Home Medications   Prior to Admission medications   Medication Sig Start Date End Date Taking? Authorizing Provider  amoxicillin-clavulanate (AUGMENTIN) 875-125 MG per tablet Take 1 tablet by  mouth 2 (two) times daily. One po bid x 7 days 07/27/14   Enid Skeens, MD  HYDROcodone-acetaminophen [facility]) 5-325 MG per tablet Take 2 tablets by mouth every 4 (four) hours as needed. 07/27/14   Enid Skeens, MD  Multiple Vitamin (MULITIVITAMIN WITH MINERALS) TABS Take 1 tablet by mouth daily.    Historical Provider, MD  naproxen (NAPROSYN) 500 MG tablet Take 1 tablet (500 mg total) by mouth 2 (two) times daily as needed. 07/27/14   Enid Skeens, MD   BP 147/93 mmHg  Pulse 72  Temp(Src) 98.7 F (37.1 C) (Oral)  Resp 18  Ht  (1.727 m)  Wt 240 lb (108.863 kg)  BMI 36.50 kg/m2  SpO2 100%  LMP 06/24/2014 Physical Exam  Constitutional: She is oriented to person, place, and time. She appears well-developed and well-nourished.  HENT:  Head: Normocephalic.  Patient has erythema posterior pharynx, left sided swelling significant concerning for peritonsillar abscess with mild deviation of uvula to the right. No significant trismus. Neck supple no meningismus no stridor.  Eyes: Conjunctivae are normal. Right eye exhibits no discharge. Left eye exhibits no discharge.  Neck: Normal range of motion. Neck supple. No tracheal deviation present.  Cardiovascular: Normal rate and regular rhythm.   Pulmonary/Chest: Effort normal and breath sounds normal.  Abdominal: Soft. She exhibits no distension. There is no tenderness. There is no guarding.  Musculoskeletal: She exhibits no edema.  Neurological: She is alert and oriented to person, place, and time.  Skin: Skin is warm. No rash noted.  Psychiatric: She has a normal mood and affect.  Nursing note and vitals  reviewed.   ED Course  Procedures (including critical care time) EMERGENCY DEPARTMENT US SOFT TISSUE INTERPRETATION "Study: Limited Soft Tissue Ultrasound"  INDICATIONS: Pain and Soft tissue infection Multiple views of the body part were obtained in real-time with a multi-frequency linear probe PERFORMED BY:  Myself IMAGES  ARCHIVED?: Yes SIDE:Left BODY PART:Neck pharynx posterior left FINDINGS: Abcess present and Cellulitis absent INTERPRETATION:  Abcess present  Peritonsillar abscess incision and drainage. Indication left pharyngeal swelling/protrusion and pharyngitis Discussed risks and benefits of performing procedure with 18-gauge needle with safety At approximately 1.5 cm. Lidocaine nebulizer use prior an ultrasound used prior. Patient tolerated well. 2 mL of pus with small amount of blood obtained with needle aspiration. Swelling improved. Suction utilize for mild bleeding.     Labs Review Labs Reviewed  CBC WITH DIFFERENTIAL/PLATELET - Abnormal; Notable for the following:    Hemoglobin 11.8 (*)    HCT 34.4 (*)    MCV 75.1 (*)    MCH 25.8 (*)    All other components within normal limits  BASIC METABOLIC PANEL - Abnormal; Notable for the following:    Glucose, Bld 101 (*)    Calcium 8.2 (*)    Anion gap 3 (*)    All other components within normal limits    Imaging Review No results found.   EKG Interpretation None      MDM   Final diagnoses:  Peritonsillar abscess  Acute pharyngitis due to other specified organisms   Patient with worsening pharyngitis clinically concern for peritonsillar abscess. Plan for bedside ultrasound to confirm extent of abscess and bedside drainage with close outpatient follow-up with ENT on Monday. I've ordered fluids and steroids. Patient has no submandibular or sublingual swelling, no neck swelling. No trismus on exam.  Patient tolerated incision and drainage with 18-gauge needle after lidocaine nebulizer. Instructed close follow-up with ENT specialist. Will hold on radiation/CT scan at this time is unlikely to change plan an incision and drainage successful.  Results and differential diagnosis were discussed with the patient/parent/guardian. Close follow up outpatient was discussed, comfortable with the plan.   Medications  lidocaine (XYLOCAINE) 2  % injection 2 mL (not administered)  Ampicillin-Sulbactam (UNASYN) 3 g in sodium chloride 0.9 % 100 mL IVPB (0 g Intravenous Stopped 07/27/14 1231)  dexamethasone (DECADRON) injection 10 mg (10 mg Intravenous Given 07/27/14 1051)  sodium chloride 0.9 % bolus 1,000 mL (1,000 mLs Intravenous New Bag/Given 07/27/14 1051)  ketorolac (TORADOL) 15 MG/ML injection 15 mg (15 mg Intravenous Given 07/27/14 1051)  morphine 4 MG/ML injection 4 mg (4 mg Intravenous Given 07/27/14 1051)  lidocaine (PF) (XYLOCAINE) 1 % injection (2 mLs Inhalation Given 07/27/14 1233)    Filed Vitals:   07/27/14 0938 07/27/14 1147 07/27/14 1234  BP: 144/75 147/93   Pulse: 68 72   Temp: 98.7 F (37.1 C)    TempSrc: Oral    Resp: 18 18   Height: 5\' 8"  (1.727 m)    Weight: 240 lb (108.863 kg)    SpO2: 99% 99% 100%    Final diagnoses:  Peritonsillar abscess  Acute pharyngitis due to other specified organisms        Enid SkeensJoshua M Yuta Cipollone, MD 07/27/14 1310

## 2014-07-27 NOTE — ED Notes (Signed)
Pt alert and oriented and denies shortness of breath. Tonsil doesn't appear to be draining and edema has decreased from earlier assessment. Pt able to tolerate liquids. Danna HeftyGolden, Kaelyn Innocent Lee, RN

## 2014-07-27 NOTE — Discharge Instructions (Signed)
If you were given medicines take as directed.  If you are on coumadin or contraceptives realize their levels and effectiveness is altered by many different medicines.  If you have any reaction (rash, tongues swelling, other) to the medicines stop taking and see a physician.   Follow-up closely with ENT specialist for reassessment, you may need further procedure done depending on your clinical status. If you develop breathing difficulties worsening swelling or new concerns return to the emergency department in the meantime. For severe pain take norco or vicodin however realize they have the potential for addiction and it can make you sleepy and has tylenol in it.  No operating machinery while taking.  Please follow up as directed and return to the ER or see a physician for new or worsening symptoms.  Thank you. Filed Vitals:   07/27/14 0938 07/27/14 1147 07/27/14 1234  BP: 144/75 147/93   Pulse: 68 72   Temp: 98.7 F (37.1 C)    TempSrc: Oral    Resp: 18 18   Height: 5\' 8"  (1.727 m)    Weight: 240 lb (108.863 kg)    SpO2: 99% 99% 100%

## 2014-07-27 NOTE — ED Notes (Signed)
Patient here for further evaluation of ongoing sore throat and neck pain. Reports that she was diagnosed with pharyngitis and still not feeling any better

## 2014-07-30 LAB — WOUND CULTURE
CULTURE: NO GROWTH
GRAM STAIN: NONE SEEN

## 2014-08-05 NOTE — ED Notes (Signed)
Pt called stating that she had developed a yeast infection, requesting medication to treat that. Spoke with MD Madilyn Hookees, who advised for patient to use OTC Monistat for treatment. Pt agreement with plan. She understands to come back and be seen if no better.

## 2014-08-31 ENCOUNTER — Emergency Department (HOSPITAL_COMMUNITY)
Admission: EM | Admit: 2014-08-31 | Discharge: 2014-08-31 | Disposition: A | Discharge status: Home / Self Care | Payer: Managed Care, Other (non HMO) | Attending: Emergency Medicine | Admitting: Emergency Medicine

## 2014-08-31 ENCOUNTER — Encounter (HOSPITAL_COMMUNITY): Payer: Self-pay | Admitting: *Deleted

## 2014-08-31 DIAGNOSIS — Z720 Tobacco use: Secondary | ICD-10-CM | POA: Diagnosis not present

## 2014-08-31 DIAGNOSIS — G8918 Other acute postprocedural pain: Secondary | ICD-10-CM | POA: Diagnosis not present

## 2014-08-31 DIAGNOSIS — Z792 Long term (current) use of antibiotics: Secondary | ICD-10-CM | POA: Insufficient documentation

## 2014-08-31 DIAGNOSIS — I10 Essential (primary) hypertension: Secondary | ICD-10-CM | POA: Diagnosis not present

## 2014-08-31 DIAGNOSIS — R070 Pain in throat: Secondary | ICD-10-CM | POA: Diagnosis present

## 2014-08-31 DIAGNOSIS — Z8614 Personal history of Methicillin resistant Staphylococcus aureus infection: Secondary | ICD-10-CM | POA: Insufficient documentation

## 2014-08-31 DIAGNOSIS — Z79899 Other long term (current) drug therapy: Secondary | ICD-10-CM | POA: Diagnosis not present

## 2014-08-31 MED ORDER — LIDOCAINE VISCOUS 2 % MT SOLN
15.0000 mL | Freq: Once | OROMUCOSAL | Status: AC
  Administered 2014-08-31: 15 mL via OROMUCOSAL
  Filled 2014-08-31: qty 15

## 2014-08-31 MED ORDER — KETOROLAC TROMETHAMINE 30 MG/ML IJ SOLN
30.0000 mg | Freq: Once | INTRAMUSCULAR | Status: AC
  Administered 2014-08-31: 30 mg via INTRAMUSCULAR
  Filled 2014-08-31: qty 1

## 2014-08-31 MED ORDER — PSEUDOEPHEDRINE HCL 30 MG PO TABS
30.0000 mg | ORAL_TABLET | Freq: Once | ORAL | Status: AC
  Administered 2014-08-31: 30 mg via ORAL
  Filled 2014-08-31: qty 1

## 2014-08-31 MED ORDER — LIDOCAINE VISCOUS 2 % MT SOLN: 15.0000 mL | Freq: Four times a day (QID) | OROMUCOSAL | Status: AC | PRN

## 2014-08-31 NOTE — Discharge Instructions (Signed)
As discussed, it is important that you follow up as with your physician for continued management of your condition.  If you develop any new, or concerning changes in your condition, please return to the emergency department immediately.   Pain Relief Preoperatively and Postoperatively Being a good patient does not mean being a silent one.If you have questions, problems, or concerns about the pain you may feel after surgery, let your caregiver know.Patients have the right to assessment and management of pain. The treatment of pain after surgery is important to speed up recovery and return to normal activities. Severe pain after surgery, and the fear or anxiety associated with that pain, may cause extreme discomfort that:  Prevents sleep.  Decreases the ability to breathe deeply and cough. This can cause pneumonia or other upper airway infections.  Causes your heart to beat faster and your blood pressure to be higher.  Increases the risk for constipation and bloating.  Decreases the ability of wounds to heal.  May result in depression, increased anxiety, and feelings of helplessness. Relief of pain before surgery is also important because it will lessen the pain after surgery. Patients who receive both pain relief before and after surgery experience greater pain relief than those who only receive pain relief after surgery. Let your caregiver know if you are having uncontrolled pain.This is very important.Pain after surgery is more difficult to manage if it is permitted to become severe, so prompt and adequate treatment of acute pain is necessary. PAIN CONTROL METHODS Your caregivers follow policies and procedures about the management of patient pain.These guidelines should be explained to you before surgery.Plans for pain control after surgery must be mutually decided upon and instituted with your full understanding and agreement.Do not be afraid to ask questions regarding the care you are  receiving.There are many different ways your caregivers will attempt to control your pain, including the following methods. As needed pain control  You may be given pain medicine either through your intravenous (IV) tube, or as a pill or liquid you can swallow. You will need to let your caregiver know when you are having pain. Then, your caregiver will give you the pain medicine ordered for you.  Your pain medicine may make you constipated. If constipation occurs, drink more liquids if you can. Your caregiver may have you take a mild laxative. IV patient-controlled analgesia pump (PCA pump)  You can get your pain medicine through the IV tube which goes into your vein. You are able to control the amount of pain medicine that you get. The pain medicine flows in through an IV tube and is controlled by a pump. This pump gives you a set amount of pain medicine when you push the button hooked up to it. Nobody should push this button but you or someone specifically assigned by you to do so. It is set up to keep you from accidentally giving yourself too much pain medicine. You will be able to start using your pain pump in the recovery room after your surgery. This method can be helpful for most types of surgery.  If you are still having too much pain, tell your caregiver. Also, tell your caregiver if you are feeling too sleepy or nauseous. Continuous epidural pain control  A thin, soft tube (catheter) is put into your back. Pain medicine flows through the catheter to lessen pain in the part of your body where the surgery is done. Continuous epidural pain control may work best for you if you  are having surgery on your chest, abdomen, hip area, or legs. The epidural catheter is usually put into your back just before surgery. The catheter is left in until you can eat and take medicine by mouth. In most cases, this may take 2 to 3 days.  Giving pain medicine through the epidural catheter may help you heal faster  because:  Your bowel gets back to normal faster.  You can get back to eating sooner.  You can be up and walking sooner. Medicine that numbs the area (local anesthetic)  You may receive an injection of pain medicine near where the pain is (local infiltration).  You may receive an injection of pain medicine near the nerve that controls the sensation to a specific part of the body (peripheral nerve block).  Medicine may be put in the spine to block pain (spinal block). Opioids  Moderate to moderately severe acute pain after surgery may respond to opioids.Opioids are narcotic pain medicine. Opioids are often combined with non-narcotic medicines to improve pain relief, diminish the risk of side effects, and reduce the chance of addiction.  If you follow your caregiver's directions about taking opioids and you do not have a history of substance abuse, your risk of becoming addicted is exceptionally small.Opioids are given for short periods of time in careful doses to prevent addiction. Other methods of pain control include:  Steroids.  Physical therapy.  Heat and cold therapy.  Compression, such as wrapping an elastic bandage around the area of pain.  Massage. These various ways of controlling pain may be used together. Combining different methods of pain control is called multimodal analgesia. Using this approach has many benefits, including being able to eat, move around, and leave the hospital sooner. Document Released: 08/21/2002 Document Revised: 08/23/2011 Document Reviewed: 08/25/2010 [facility] Patient Information 2015 [employer], [place]. This information is not intended to replace advice given to you by your health care provider. Make sure you discuss any questions you have with your health care provider.

## 2014-08-31 NOTE — ED Provider Notes (Signed)
CSN: [id number]     Arrival date & time 08/31/14  1025 History   First MD Initiated Contact with Patient 08/31/14 1034     Chief Complaint  Patient presents with  . Post-op Problem     HPI  Patient presents 3 days after having her tonsils removed.  Patient generally well, but had an episode of tonsillar abscess earlier this year, and had elective tonsillectomy after recovery from her infection. She states that since the tonsillectomy she has had persistent severe throat discomfort in spite of using oral narcotics. No new lightheadedness, syncope, chest pain, dyspnea. There is anorexia, nausea, but no vomiting. Patient has not spoken with her ENT physician yet.      Past Medical History  Diagnosis Date  . MRSA (methicillin resistant Staphylococcus aureus)   . Hypertension    History reviewed. No pertinent past surgical history. History reviewed. No pertinent family history. History  Substance Use Topics  . Smoking status: Current Every Day Smoker  . Smokeless tobacco: Not on file  . Alcohol Use: Yes   OB History    No data available     Review of Systems  Constitutional:       Per HPI, otherwise negative  HENT:       Per HPI, otherwise negative  Respiratory:       Per HPI, otherwise negative  Cardiovascular:       Per HPI, otherwise negative  Gastrointestinal: Negative for vomiting.  Endocrine:       Negative aside from HPI  Genitourinary:       Neg aside from HPI   Musculoskeletal:       Per HPI, otherwise negative  Skin: Negative.   Allergic/Immunologic: Negative for immunocompromised state.  Neurological: Negative for syncope, light-headedness and headaches.      Allergies  Review of patient's allergies indicates no known allergies.  Home Medications   Prior to Admission medications   Medication Sig Start Date End Date Taking? Authorizing Provider  amoxicillin-clavulanate (AUGMENTIN) 875-125 MG per tablet Take 1 tablet by mouth 2 (two) times  daily. One po bid x 7 days 07/27/14   Blane OharaJoshua Zavitz, MD  HYDROcodone-acetaminophen [facility](NORCO) 5-325 MG per tablet Take 2 tablets by mouth every 4 (four) hours as needed. 07/27/14   Blane OharaJoshua Zavitz, MD  Multiple Vitamin (MULITIVITAMIN WITH MINERALS) TABS Take 1 tablet by mouth daily.    Historical Provider, MD  naproxen (NAPROSYN) 500 MG tablet Take 1 tablet (500 mg total) by mouth 2 (two) times daily as needed. 07/27/14   Blane OharaJoshua Zavitz, MD   BP 126/84 mmHg  Pulse 64  Temp(Src) 98.6 F (37 C) (Oral)  Resp 18  Ht 5\' 8"  (1.727 m)  Wt 231 lb (104.781 kg)  BMI 35.13 kg/m2  SpO2 99%  LMP 08/21/2014 Physical Exam  Constitutional: She is oriented to person, place, and time. She appears well-developed and well-nourished. No distress.  HENT:  Head: Normocephalic and atraumatic.  Mouth/Throat:    Eyes: Conjunctivae and EOM are normal.  Neck: No tracheal deviation present. No thyromegaly present.  Pulmonary/Chest: Effort normal. No stridor. No respiratory distress.  Abdominal: She exhibits no distension.  Musculoskeletal: She exhibits no edema.  Lymphadenopathy:    She has no cervical adenopathy.  Neurological: She is alert and oriented to person, place, and time. No cranial nerve deficit.  Skin: Skin is warm and dry.  Psychiatric: She has a normal mood and affect.  Nursing note and vitals reviewed.   ED Course  Procedures (  including critical care time)   1:56 PM Patient eating applesauce   MDM   Patient presents with postoperative pain following elective tonsillectomy. Here the patient has a notable postoperative changes in the posterior oropharynx, though no swelling, erythema, evidence for eminent respiratory nor for new infection. Patient had pain resolution with Toradol, steroids, was tolerant of oral intake. Patient will follow up with ENT in 2 days.    Gerhard Munch, MD 08/31/14 1356

## 2014-08-31 NOTE — ED Notes (Signed)
Pt reports having her tonsils removed on wed, still having pain and unable to swallow anything, including liquids. Cant take pain meds due to being unable to swallow and having nausea on empty stomach. Also having ear pain and drainage. Airway intact.

## 2015-01-22 ENCOUNTER — Emergency Department (HOSPITAL_BASED_OUTPATIENT_CLINIC_OR_DEPARTMENT_OTHER)
Admission: EM | Admit: 2015-01-22 | Discharge: 2015-01-22 | Disposition: A | Discharge status: Home / Self Care | Payer: Managed Care, Other (non HMO) | Attending: Emergency Medicine | Admitting: Emergency Medicine

## 2015-01-22 ENCOUNTER — Encounter (HOSPITAL_BASED_OUTPATIENT_CLINIC_OR_DEPARTMENT_OTHER): Payer: Self-pay | Admitting: *Deleted

## 2015-01-22 DIAGNOSIS — I10 Essential (primary) hypertension: Secondary | ICD-10-CM | POA: Diagnosis not present

## 2015-01-22 DIAGNOSIS — Z8614 Personal history of Methicillin resistant Staphylococcus aureus infection: Secondary | ICD-10-CM | POA: Insufficient documentation

## 2015-01-22 DIAGNOSIS — Z791 Long term (current) use of non-steroidal anti-inflammatories (NSAID): Secondary | ICD-10-CM | POA: Diagnosis not present

## 2015-01-22 DIAGNOSIS — Z720 Tobacco use: Secondary | ICD-10-CM | POA: Insufficient documentation

## 2015-01-22 DIAGNOSIS — J029 Acute pharyngitis, unspecified: Secondary | ICD-10-CM

## 2015-01-22 DIAGNOSIS — Z79899 Other long term (current) drug therapy: Secondary | ICD-10-CM | POA: Insufficient documentation

## 2015-01-22 NOTE — ED Provider Notes (Signed)
CSN: [id number]     Arrival date & time 01/22/15  4098 History   First MD Initiated Contact with Patient 01/22/15 [phone]     Chief Complaint  Patient presents with  . Sore Throat    Patient is a 34 y.o. female presenting with pharyngitis. The history is provided by the patient.  Sore Throat This is a new problem. The current episode started 2 days ago. The problem occurs daily. The problem has been gradually worsening. Pertinent negatives include no shortness of breath. The symptoms are aggravated by swallowing. Nothing relieves the symptoms.    Past Medical History  Diagnosis Date  . MRSA (methicillin resistant Staphylococcus aureus)   . Hypertension    Past Surgical History  Procedure Laterality Date  . Tonsillectomy     No family history on file. Social History  Substance Use Topics  . Smoking status: Current Every Day Smoker  . Smokeless tobacco: None  . Alcohol Use: Yes   OB History    No data available     Review of Systems  Constitutional: Negative for fever.  HENT: Positive for congestion.   Respiratory: Positive for cough. Negative for shortness of breath.       Allergies  Review of patient's allergies indicates no known allergies.  Home Medications   Prior to Admission medications   Medication Sig Start Date End Date Taking? Authorizing Provider  B Complex-C (B-COMPLEX WITH VITAMIN C) tablet Take 1 tablet by mouth daily.   Yes Historical Provider, MD  amoxicillin-clavulanate (AUGMENTIN) 875-125 MG per tablet Take 1 tablet by mouth 2 (two) times daily. One po bid x 7 days 07/27/14   Blane Ohara, MD  HYDROcodone-acetaminophen [facility]) 5-325 MG per tablet Take 2 tablets by mouth every 4 (four) hours as needed. 07/27/14   Blane Ohara, MD  Multiple Vitamin (MULITIVITAMIN WITH MINERALS) TABS Take 1 tablet by mouth daily.    Historical Provider, MD  naproxen (NAPROSYN) 500 MG tablet Take 1 tablet (500 mg total) by mouth 2 (two) times daily as needed. 07/27/14    Blane Ohara, MD   BP 131/71 mmHg  Pulse 63  Temp(Src) 97.9 F (36.6 C) (Oral)  Resp 18  Ht  (1.727 m)  Wt 245 lb (111.131 kg)  BMI 37.26 kg/m2  SpO2 100%  LMP 01/15/2015 Physical Exam CONSTITUTIONAL: Well developed/well nourished HEAD: Normocephalic/atraumatic EYES: EOMI/PERRL ENMT: Mucous membranes moist, uvula midline, no exudates, mild erythema noted NECK: supple no meningeal signs, no lymphadenopathy CV: S1/S2 noted, no murmurs/rubs/gallops noted LUNGS: Lungs are clear to auscultation bilaterally, no apparent distress ABDOMEN: soft NEURO: Pt is awake/alert/appropriate, moves all extremitiesx4.  No facial droop.   EXTREMITIES: pulses normal/equal, full ROM SKIN: warm, color normal PSYCH: no abnormalities of mood noted, alert and oriented to situation  ED Course  Procedures  Suspect viral illness  MDM   Final diagnoses:  Pharyngitis    Nursing notes including past medical history and social history reviewed and considered in documentation     Zadie Rhine, MD 01/22/15 1008

## 2015-01-22 NOTE — ED Notes (Signed)
Pt c/o sore throat, dry cough and sinus congestion x 2 days. Pt denies fever.

## 2015-02-16 ENCOUNTER — Encounter (HOSPITAL_BASED_OUTPATIENT_CLINIC_OR_DEPARTMENT_OTHER): Payer: Self-pay | Admitting: Emergency Medicine

## 2015-02-16 ENCOUNTER — Emergency Department (HOSPITAL_BASED_OUTPATIENT_CLINIC_OR_DEPARTMENT_OTHER)
Admission: EM | Admit: 2015-02-16 | Discharge: 2015-02-16 | Disposition: A | Discharge status: Home / Self Care | Payer: Managed Care, Other (non HMO) | Attending: Emergency Medicine | Admitting: Emergency Medicine

## 2015-02-16 DIAGNOSIS — I10 Essential (primary) hypertension: Secondary | ICD-10-CM | POA: Diagnosis not present

## 2015-02-16 DIAGNOSIS — Z79899 Other long term (current) drug therapy: Secondary | ICD-10-CM | POA: Insufficient documentation

## 2015-02-16 DIAGNOSIS — Z8614 Personal history of Methicillin resistant Staphylococcus aureus infection: Secondary | ICD-10-CM | POA: Diagnosis not present

## 2015-02-16 DIAGNOSIS — L03113 Cellulitis of right upper limb: Secondary | ICD-10-CM | POA: Diagnosis present

## 2015-02-16 DIAGNOSIS — Z720 Tobacco use: Secondary | ICD-10-CM | POA: Insufficient documentation

## 2015-02-16 DIAGNOSIS — Z792 Long term (current) use of antibiotics: Secondary | ICD-10-CM | POA: Insufficient documentation

## 2015-02-16 DIAGNOSIS — Z23 Encounter for immunization: Secondary | ICD-10-CM | POA: Insufficient documentation

## 2015-02-16 MED ORDER — CLINDAMYCIN HCL 300 MG PO CAPS: 300.0000 mg | ORAL_CAPSULE | Freq: Four times a day (QID) | ORAL | Status: DC

## 2015-02-16 MED ORDER — HYDROCORTISONE 2.5 % EX OINT: TOPICAL_OINTMENT | Freq: Two times a day (BID) | CUTANEOUS | Status: DC

## 2015-02-16 MED ORDER — TETANUS-DIPHTH-ACELL PERTUSSIS 5-2.5-18.5 LF-MCG/0.5 IM SUSP
0.5000 mL | Freq: Once | INTRAMUSCULAR | Status: AC
  Administered 2015-02-16: 0.5 mL via INTRAMUSCULAR
  Filled 2015-02-16: qty 0.5

## 2015-02-16 NOTE — ED Notes (Signed)
Pt thinks she was bite by something walking into work, unsure what bite her, this am her left elbow is swollen and tender to touch

## 2015-02-16 NOTE — ED Provider Notes (Signed)
CSN: [id number]     Arrival date & time 02/16/15  1058 History   First MD Initiated Contact with Patient 02/16/15 1105     Chief Complaint  Patient presents with  . Cellulitis     (Consider location/radiation/quality/duration/timing/severity/associated sxs/prior Treatment) HPI Comments: Patient presents with redness and swelling to her right upper arm. She states she feels like she was bitten by something yesterday morning while walking to work. She has profound itching to the area and has had some increased redness and swelling this morning. She does have a history of MRSA. She denies any fevers. Her tetanus shot is not up-to-date.   Past Medical History  Diagnosis Date  . MRSA (methicillin resistant Staphylococcus aureus)   . Hypertension    Past Surgical History  Procedure Laterality Date  . Tonsillectomy     History reviewed. No pertinent family history. Social History  Substance Use Topics  . Smoking status: Current Every Day Smoker  . Smokeless tobacco: None  . Alcohol Use: Yes   OB History    No data available     Review of Systems  Constitutional: Negative for fever.  Gastrointestinal: Negative for nausea and vomiting.  Musculoskeletal: Negative for back pain, joint swelling, arthralgias and neck pain.  Skin: Positive for color change and rash. Negative for wound.  Neurological: Negative for weakness, numbness and headaches.      Allergies  Review of patient's allergies indicates no known allergies.  Home Medications   Prior to Admission medications   Medication Sig Start Date End Date Taking? Authorizing Provider  amoxicillin-clavulanate (AUGMENTIN) 875-125 MG per tablet Take 1 tablet by mouth 2 (two) times daily. One po bid x 7 days 07/27/14   Blane Ohara, MD  B Complex-C (B-COMPLEX WITH VITAMIN C) tablet Take 1 tablet by mouth daily.    Historical Provider, MD  clindamycin (CLEOCIN) 300 MG capsule Take 1 capsule (300 mg total) by mouth 4 (four) times  daily. X 7 days 02/16/15   Rolan Bucco, MD  HYDROcodone-acetaminophen (NORCO) 5-325 MG per tablet Take 2 tablets by mouth every 4 (four) hours as needed. 07/27/14   Blane Ohara, MD  hydrocortisone 2.5 % ointment Apply topically 2 (two) times daily. 02/16/15   Rolan Bucco, MD  Multiple Vitamin (MULITIVITAMIN WITH MINERALS) TABS Take 1 tablet by mouth daily.    Historical Provider, MD  naproxen (NAPROSYN) 500 MG tablet Take 1 tablet (500 mg total) by mouth 2 (two) times daily as needed. 07/27/14   Blane Ohara, MD   BP 138/84 mmHg  Pulse 71  Temp(Src) 98.3 F (36.8 C) (Oral)  Resp 18  Ht 5' 7.5" (1.715 m)  Wt 240 lb (108.863 kg)  BMI 37.01 kg/m2  SpO2 98%  LMP 01/15/2015 Physical Exam  Constitutional: She is oriented to person, place, and time. She appears well-developed and well-nourished.  HENT:  Head: Normocephalic and atraumatic.  Neck: Normal range of motion. Neck supple.  Cardiovascular: Normal rate.   Pulmonary/Chest: Effort normal.  Musculoskeletal: She exhibits tenderness. She exhibits no edema.  Patient has about a 4 cm area of erythema to her right upper arm, proximal to the elbow joint. There is no tenderness or swelling to the elbow joint itself. There is no induration or fluctuance.  Neurological: She is alert and oriented to person, place, and time.  Skin: Skin is warm and dry.  Psychiatric: She has a normal mood and affect.    ED Course  Procedures (including critical care time) Labs Review Labs  Reviewed - No data to display  Imaging Review No results found. I have personally reviewed and evaluated these images and lab results as part of my medical decision-making.   EKG Interpretation None      MDM   Final diagnoses:  Cellulitis of right upper extremity    Patient reports an insect bite to her right upper arm with inflammatory changes. She does have a history of MRSA so I will go ahead and treat her with clindamycin. I also gave her prescription for a  steroid cream to use to the area. Her tetanus shot was updated. She was advised to return if she has any worsening redness swelling or pain.    Rolan Bucco, MD 02/16/15 1125

## 2015-02-16 NOTE — Discharge Instructions (Signed)

## 2015-02-16 NOTE — ED Notes (Signed)
MD at bedside. 

## 2015-02-16 NOTE — ED Notes (Signed)
Pt states has insect bite at Rt elbow area, occurred yesterday, area red, slightly swollen

## 2015-06-30 ENCOUNTER — Encounter (HOSPITAL_BASED_OUTPATIENT_CLINIC_OR_DEPARTMENT_OTHER): Payer: Self-pay | Admitting: *Deleted

## 2015-06-30 ENCOUNTER — Emergency Department (HOSPITAL_BASED_OUTPATIENT_CLINIC_OR_DEPARTMENT_OTHER)
Admission: EM | Admit: 2015-06-30 | Discharge: 2015-06-30 | Disposition: A | Discharge status: Home / Self Care | Payer: Managed Care, Other (non HMO) | Attending: Emergency Medicine | Admitting: Emergency Medicine

## 2015-06-30 DIAGNOSIS — L03114 Cellulitis of left upper limb: Secondary | ICD-10-CM | POA: Diagnosis not present

## 2015-06-30 DIAGNOSIS — Z8614 Personal history of Methicillin resistant Staphylococcus aureus infection: Secondary | ICD-10-CM | POA: Insufficient documentation

## 2015-06-30 DIAGNOSIS — F17200 Nicotine dependence, unspecified, uncomplicated: Secondary | ICD-10-CM | POA: Insufficient documentation

## 2015-06-30 DIAGNOSIS — I10 Essential (primary) hypertension: Secondary | ICD-10-CM | POA: Insufficient documentation

## 2015-06-30 DIAGNOSIS — L02414 Cutaneous abscess of left upper limb: Secondary | ICD-10-CM | POA: Diagnosis present

## 2015-06-30 DIAGNOSIS — Z79899 Other long term (current) drug therapy: Secondary | ICD-10-CM | POA: Insufficient documentation

## 2015-06-30 MED ORDER — SULFAMETHOXAZOLE-TRIMETHOPRIM 800-160 MG PO TABS: 1.0000 | ORAL_TABLET | Freq: Two times a day (BID) | ORAL | Status: AC

## 2015-06-30 MED ORDER — HYDROCORTISONE 1 % EX CREA: TOPICAL_CREAM | CUTANEOUS | Status: DC

## 2015-06-30 MED FILL — SULFAMETHOXAZOLE-TMP DS TAB: 800-160 | 7 days supply | Qty: 14 | Fill #0

## 2015-06-30 NOTE — ED Notes (Signed)
Patient states she developed a itchy bump on her left elbow three days ago.  States she now has multiple areas around the elbow.  History of MRSA three years ago.

## 2015-06-30 NOTE — Discharge Instructions (Signed)
Take your medications as prescribed. Continue keeping lesions clean with antibacterial soap and water and dry. Please follow up with a primary care provider from the Resource Guide provided below in 3-4 days. Please return to the Emergency Department if symptoms worsen or new onset of fever, swelling, drainage.    Emergency Department Resource Guide 1) Find a Doctor and Pay Out of Pocket Although you won't have to find out who is covered by your insurance plan, it is a good idea to ask around and get recommendations. You will then need to call the office and see if the doctor you have chosen will accept you as a new patient and what types of options they offer for patients who are self-pay. Some doctors offer discounts or will set up payment plans for their patients who do not have insurance, but you will need to ask so you aren't surprised when you get to your appointment.  2) Contact Your Local Health Department Not all health departments have doctors that can see patients for sick visits, but many do, so it is worth a call to see if yours does. If you don't know where your local health department is, you can check in your phone book. The CDC also has a tool to help you locate your state's health department, and many state websites also have listings of all of their local health departments.  3) Find a Walk-in Clinic If your illness is not likely to be very severe or complicated, you may want to try a walk in clinic. These are popping up all over the country in pharmacies, drugstores, and shopping centers. They're usually staffed by nurse practitioners or physician assistants that have been trained to treat common illnesses and complaints. They're usually fairly quick and inexpensive. However, if you have serious medical issues or chronic medical problems, these are probably not your best option.  No Primary Care Doctor: - Call Health Connect at  [phone] - they can help you locate a primary care  doctor that  accepts your insurance, provides certain services, etc. - Physician Referral Service- [phone]  Chronic Pain Problems: Organization         Address  Phone   Notes  [facility]  [phone] Patients need to be referred by their primary care doctor.   Medication Assistance: Organization         Address  Phone   Notes  [facility] [address] [place], [place] [address] [phone]) [phone] --Must be a resident of [place] -- Must have NO insurance coverage whatsoever (no Medicaid/ Medicare, etc.) -- The pt. MUST have a primary care doctor that directs their care regularly and follows them in the community   MedAssist  [phone]) [phone]   [employer]  [phone]    Agencies that provide inexpensive medical care: Organization         Address  Phone   Notes  [facility]  [phone]   [facility]    [phone]   [facility] [address] [place], [place] [address] [phone]   [facility] [address], [place] [phone]) [phone]   [facility]    [phone]) [phone]   [facility]    [phone]   [facility]  [address], [place] Phone:  [phone], Fax:  [phone]) [phone] Hours of Operation:  9  am - 6 pm, M-F.  Also accepts Medicaid/Medicare and self-pay.  [facility]  [address], [place] Phone: [phone], Fax: [phone]) [phone]. Hours of Operation:  8:30 am - 5:30 pm, M-F.  Also accepts Medicaid and self-pay.  [facility] [place] [address], [place] Phone: [phone]   [facility] [address], [place], [place] [phone], Ext. [phone] Mondays & Thursdays: 7-9 AM.  First 15 patients are seen on a first come, first serve basis.    Medicaid-accepting [facility]  Providers:  Organization         Address  Phone   Notes  [facility] [address], [place] [phone]) [phone] Also accepts self-pay patients.  [facility] [address] [place], [place]  [phone]   [facility] [address], [place] [phone]) [phone]   [facility] [address] [place] [address], [place] [phone]   Renaye RakersVeita Bland [address], [place]   [phone] Only accepts [place] Access [place] patients after they have their name applied to their card.   Self-Pay (no insurance) in [facility] [place]:  Organization         Address  Phone   Notes  Sickle Cell Patients, [facility] [address], [place] [phone]) [phone]   [facility] [address], [place] [phone]) [phone]   [facility] [place]  [address], [place] [phone]) [phone]   [facility]/Dr. Osei-Bonsu  [address] [place] [address], [place] or [phone], [place] [phone]) [phone] Phone number for both [place] and [place] locations is the same.  [facility] [address], [place] [phone]) [phone]   [facility] [place] [address] [place] [address], [place] or [address] [phone]) [phone]   [facility] [address], [place] [phone]) [phone], phone; [phone]) [phone], fax Sees patients 1st and 3rd Saturday of every month.  Must not qualify for public or private insurance (i.e. Medicaid, Medicare, [place] Health Choice, Veterans' Benefits)  Household income should be no more than 200% of the poverty level The clinic cannot treat you if you are pregnant or think you are pregnant  Sexually transmitted diseases are not treated at the clinic.    Dental Care: Organization         Address  Phone  Notes  [facility] [address], [place] [phone] Accepts children up to age 37 who are enrolled in [place] or [place] Health Choice; pregnant women with a Medicaid card; and children who have applied for Medicaid or [place] Health Choice, but were declined, whose parents can pay a reduced fee at time of service.  [facility] [place]  [address], [place] [phone] Accepts children up to age 37 who are enrolled in [place] or [place] Health Choice; pregnant women with a Medicaid card; and children who have applied for Medicaid or [place] Health Choice, but were declined, whose parents can pay a reduced fee at time of service.  [facility]  [address], [place] [phone]) [phone] Patients are seen by appointment only. Walk-ins are not accepted. [facility] will see patients 18 years of  age and older. °Monday - Tuesday (8am-5pm) °Most Wednesdays (8:30-5pm) °$30 per visit, cash only  °[facility] ° [address], [place] [phone] Patients are seen by appointment only. Walk-ins are not accepted. [facility] will see patients 18 years of age and older. °One Wednesday Evening (Monthly: Volunteer Based).  $30 per visit, cash only  °[facility]  [phone] for adults; Children under age 4, call Graduate Pediatric Dentistry at [phone]. Children aged 4-14, please call [phone] to request a pediatric application. ° Dental services are provided in all areas of dental care including fillings, crowns and bridges, complete and partial dentures, implants, gum treatment, root canals, and extractions. Preventive care is also provided. Treatment is provided to both adults and children. °Patients are selected via a lottery and there is often a waiting list. °  °[facility] [address], °[place] ° [phone] [email or web address] °  °[facility]  [address], [place], [place] [phone], Ext. [phone] Second and Fourth Thursday of each month, opens at 6:30 AM; Clinic ends at 9 AM.  Patients are seen on a first-come first-served basis, and a limited number are seen during each clinic.  ° °[facility] ° [address], [place], [place] [phone]   Eligibility Requirements °You must have lived in [place], [place], or [place] counties for at least the last three months. °  You cannot be eligible for state or federal sponsored healthcare insurance, including Veterans Administration, Medicaid, or Medicare. °  You generally cannot be eligible for healthcare insurance through your employer.  °  How to apply: °Eligibility screenings are held every Tuesday and Wednesday afternoon from 1:00 pm until 4:00 pm. You do not need an appointment for the interview!  °[facility] [address], [place], [place] [phone]   °[facility]  [phone]   °[place]  336-703-3100   °[facility]  336-570-6415   ° °Behavioral Health Resources in the Community: °Intensive Outpatient Programs °Organization         Address  Phone  Notes  °[place] [facility] [address], [place], [place] [phone]   °[facility] [address], [place], [place] [phone]   °[facility] [address], [place], [place] ° 336-882-2125   °[facility] [address],  °[place], [place] 1-800-853-5163 or 336-641-4981   °Substance Abuse Resources °Organization         Address  Phone  Notes  °[facility]  336-882-2125   °[facility]  336-784-9470   °[facility]  336-285-9073   °[facility]  336-845-3988   °Residential & Outpatient Substance Abuse Program  1-800-659-3381   °Psychological Services °Organization         Address  Phone  Notes  °[facility]  336- [phone]    °[facility]  336- 378-7881   °[facility] [address], [place] 1-800-853-5163 or 336-641-4981   ° °Mobile Crisis Teams °Organization         Address  Phone  Notes  °[facility], Mobile Crisis Care Unit  1-877-626-1772   °Assertive °Psychotherapeutic Services ° [address] [place], [place] 336-834-9664   °Sharon DeEsch [address] °[place] 336-554-5454   ° °Self-Help/Support Groups °Organization           Address  Phone             Notes  [place] [place] - variety of support groups  [place] Call for more information  Narcotics Anonymous (NA), [facility] [address], [employer] [place]  2 meetings at this location   Special educational needs teacher         Address  Phone  Notes  [facility] [place],    [place]  1-4044839019   [facility]  [address] [place] [address], [place], [place]   [place], [place] [phone] Admissions: 8am-3pm M-F  Incentives Substance [place] [address],    [place], [place] [phone]   [facility] [address] [place], [place], [place]   [facility] [address]  [place], [place]   [facility] - Intensive Outpatient [address], [place], [place]   [facility] ([place]  [place], [place] 1-(703)580-8802 or 5101305202   [facility] ([facility]) [address], [place], [place] Accepts Medicaid  [facility] [place] [address]  [place] [phone]-(360) [phone] Substance Abuse/Addiction Treatment   [facility] Organization         Address  Phone  Notes  [facility]  5178294896   Domenic Schwab, PhD [address] [place], [place]   478[address] or [phone]    [place], [place] [phone]   [facility] [address], [place], [place] [phone] Insurance/Medicaid/sponsorship through [facility] Families [address], [address] [place]                                    [place], [place] [phone] [place] [address], [place] [phone]    Dr. Adele Schilder  [phone]   [facility]. 1) [address] [place] 2) [place] 3)  [place] [address], [place] [phone]  [phone]   [place] [phone] or [phone] (After Hours)

## 2015-06-30 NOTE — ED Provider Notes (Signed)
CSN: [id number]     Arrival date & time 06/30/15  1191 History   First MD Initiated Contact with Patient 06/30/15 [id number]     Chief Complaint  Patient presents with  . Abscess     (Consider location/radiation/quality/duration/timing/severity/associated sxs/prior Treatment) HPI  Patient is a 35 year old female with past medical history of MRSA and hypertension who presents to the ED with complaint of left arm rash. Patient reports she first noticed 2 lesions to the volar aspect of her left forearm 3 days ago with associated redness, swelling and pruritus. She notes the lesions have started to resolve over the past few days. She notes 2 days ago she began to notice a larger lesion to the dorsal aspect of her left forearm with associated redness, swelling and pruritus. She notes she thinks she got bit by a spider and states she has had multiple episodes of MRSA in the past. Denies any recent injury/trauma. Denies fever, chills, numbness, tingling, weakness, drainage. Patient denies using any medications prior to arrival.  Past Medical History  Diagnosis Date  . MRSA (methicillin resistant Staphylococcus aureus)   . Hypertension    Past Surgical History  Procedure Laterality Date  . Tonsillectomy     No family history on file. Social History  Substance Use Topics  . Smoking status: Current Every Day Smoker  . Smokeless tobacco: None  . Alcohol Use: Yes   OB History    No data available     Review of Systems  Constitutional: Negative for fever and chills.  Musculoskeletal: Negative for myalgias, joint swelling and arthralgias.  Skin: Positive for rash.  Neurological: Negative for weakness and numbness.      Allergies  Review of patient's allergies indicates no known allergies.  Home Medications   Prior to Admission medications   Medication Sig Start Date End Date Taking? Authorizing Provider  amoxicillin-clavulanate (AUGMENTIN) 875-125 MG per tablet Take 1 tablet by mouth 2  (two) times daily. One po bid x 7 days 07/27/14   Blane Ohara, MD  B Complex-C (B-COMPLEX WITH VITAMIN C) tablet Take 1 tablet by mouth daily.    Historical Provider, MD  clindamycin (CLEOCIN) 300 MG capsule Take 1 capsule (300 mg total) by mouth 4 (four) times daily. X 7 days 02/16/15   Rolan Bucco, MD  HYDROcodone-acetaminophen (NORCO) 5-325 MG per tablet Take 2 tablets by mouth every 4 (four) hours as needed. 07/27/14   Blane Ohara, MD  hydrocortisone cream 1 % Apply to affected area 2 times daily 06/30/15   Barrett Henle, PA-C  Multiple Vitamin (MULITIVITAMIN WITH MINERALS) TABS Take 1 tablet by mouth daily.    Historical Provider, MD  naproxen (NAPROSYN) 500 MG tablet Take 1 tablet (500 mg total) by mouth 2 (two) times daily as needed. 07/27/14   Blane Ohara, MD  sulfamethoxazole-trimethoprim (BACTRIM DS,SEPTRA DS) 800-160 MG tablet Take 1 tablet by mouth 2 (two) times daily. 06/30/15 07/07/15  Satira Sark Simara Rhyner, PA-C   BP 141/79 mmHg  Pulse 69  Temp(Src) 98.1 F (36.7 C) (Oral)  Resp 18  Ht 5' 7.5" (1.715 m)  Wt 108.863 kg  BMI 37.01 kg/m2  SpO2 100%  LMP 05/11/2015 Physical Exam  Constitutional: She is oriented to person, place, and time. She appears well-developed and well-nourished.  HENT:  Head: Normocephalic and atraumatic.  Mouth/Throat: Oropharynx is clear and moist. No oropharyngeal exudate.  Eyes: Conjunctivae and EOM are normal. Right eye exhibits no discharge. Left eye exhibits no discharge. No scleral icterus.  Neck: Normal range of motion. Neck supple.  Cardiovascular: Normal rate.   Pulmonary/Chest: Effort normal. No respiratory distress.  Musculoskeletal: Normal range of motion.  FROM of left upper extremity. 5/5 strength. Sensation intact. 2+ radial pulses.   Lymphadenopathy:    She has no cervical adenopathy.  Neurological: She is alert and oriented to person, place, and time.  Skin: Skin is warm and dry.  Small erythematous blanching lesions  noted to left volar forearm distal to elbow, no surrounding swelling, no induration, fluctuance or drainage.   Three confluent erythematous blanching lesions noted to left dorsal forearm distal to elbow with mild swelling, no induration, fluctuance or drainage noted.   Nursing note and vitals reviewed.   ED Course  Procedures (including critical care time) Labs Review Labs Reviewed - No data to display  Imaging Review No results found. I have personally reviewed and evaluated these images and lab results as part of my medical decision-making.  Filed Vitals:   06/30/15 0951  BP: 141/79  Pulse: 69  Temp: 98.1 F (36.7 C)  Resp: 18     MDM   Final diagnoses:  Cellulitis of left upper extremity    Patient presents with rash to left forearm. Denies fever. VSS. Exam revealed multiple erythematous blanching lesions to left volar forearm with mild surrounding swelling, consistent with cellulitis. No evidence of an abscess. Left upper extremity otherwise neurovascularly intact. Plan to discharge patient home with Bactrim to cover for MRSA. Patient given resource guide to follow up with PCP.  Evaluation does not show pathology requring ongoing emergent intervention or admission. Pt is hemodynamically stable and mentating appropriately. Discussed findings/results and plan with patient/guardian, who agrees with plan. All questions answered. Return precautions discussed and outpatient follow up given.      Satira Sarkicole Elizabeth [place], [place]PA-C 06/30/15 1027  Gwyneth SproutWhitney Plunkett, MD 06/30/15 [phone]

## 2015-07-08 ENCOUNTER — Emergency Department (HOSPITAL_BASED_OUTPATIENT_CLINIC_OR_DEPARTMENT_OTHER)
Admission: EM | Admit: 2015-07-08 | Discharge: 2015-07-08 | Discharge status: Against Medical Advice | Payer: Managed Care, Other (non HMO) | Attending: Emergency Medicine | Admitting: Emergency Medicine

## 2015-07-08 ENCOUNTER — Encounter (HOSPITAL_BASED_OUTPATIENT_CLINIC_OR_DEPARTMENT_OTHER): Payer: Self-pay

## 2015-07-08 DIAGNOSIS — M79602 Pain in left arm: Secondary | ICD-10-CM | POA: Insufficient documentation

## 2015-07-08 DIAGNOSIS — I10 Essential (primary) hypertension: Secondary | ICD-10-CM | POA: Insufficient documentation

## 2015-07-08 DIAGNOSIS — Z3202 Encounter for pregnancy test, result negative: Secondary | ICD-10-CM | POA: Diagnosis not present

## 2015-07-08 DIAGNOSIS — F17200 Nicotine dependence, unspecified, uncomplicated: Secondary | ICD-10-CM | POA: Insufficient documentation

## 2015-07-08 LAB — PREGNANCY, URINE: Preg Test, Ur: NEGATIVE

## 2015-07-08 NOTE — ED Notes (Addendum)
Pt finished bactrim for several abscesses on upper left arm on Saturday which are gone, but since Sunday she has had redness, warmth and itchiness to underside of left arm.  No fevers, no red streaks up arm, no significant swelling, states it is getting worse since Sunday.

## 2015-07-08 NOTE — ED Notes (Signed)
Pt states she was seen here about a week ago and dx'd with cellulitis. Given Bactrim which she finished on Sat. Began to notice area spreading again since this weekend. +itching.

## 2015-07-09 ENCOUNTER — Emergency Department (HOSPITAL_BASED_OUTPATIENT_CLINIC_OR_DEPARTMENT_OTHER)
Admission: EM | Admit: 2015-07-09 | Discharge: 2015-07-09 | Disposition: A | Discharge status: Home / Self Care | Payer: Managed Care, Other (non HMO) | Attending: Emergency Medicine | Admitting: Emergency Medicine

## 2015-07-09 ENCOUNTER — Encounter (HOSPITAL_BASED_OUTPATIENT_CLINIC_OR_DEPARTMENT_OTHER): Payer: Self-pay | Admitting: *Deleted

## 2015-07-09 DIAGNOSIS — B3731 Acute candidiasis of vulva and vagina: Secondary | ICD-10-CM

## 2015-07-09 DIAGNOSIS — F17200 Nicotine dependence, unspecified, uncomplicated: Secondary | ICD-10-CM | POA: Insufficient documentation

## 2015-07-09 DIAGNOSIS — Z79899 Other long term (current) drug therapy: Secondary | ICD-10-CM | POA: Diagnosis not present

## 2015-07-09 DIAGNOSIS — B373 Candidiasis of vulva and vagina: Secondary | ICD-10-CM | POA: Insufficient documentation

## 2015-07-09 DIAGNOSIS — L03114 Cellulitis of left upper limb: Secondary | ICD-10-CM | POA: Diagnosis not present

## 2015-07-09 DIAGNOSIS — Z8614 Personal history of Methicillin resistant Staphylococcus aureus infection: Secondary | ICD-10-CM | POA: Insufficient documentation

## 2015-07-09 DIAGNOSIS — Z7952 Long term (current) use of systemic steroids: Secondary | ICD-10-CM | POA: Diagnosis not present

## 2015-07-09 DIAGNOSIS — I10 Essential (primary) hypertension: Secondary | ICD-10-CM | POA: Diagnosis not present

## 2015-07-09 DIAGNOSIS — L089 Local infection of the skin and subcutaneous tissue, unspecified: Secondary | ICD-10-CM | POA: Diagnosis present

## 2015-07-09 MED ORDER — CLINDAMYCIN HCL 150 MG PO CAPS: 150.0000 mg | ORAL_CAPSULE | Freq: Four times a day (QID) | ORAL | Status: DC

## 2015-07-09 MED ORDER — FLUCONAZOLE 200 MG PO TABS: 200.0000 mg | ORAL_TABLET | Freq: Every day | ORAL | Status: AC

## 2015-07-09 MED FILL — FLUCONAZOLE 200 MG TABLET: 200 | 7 days supply | Qty: 7 | Fill #0

## 2015-07-09 MED FILL — CLINDAMYCIN HCL 150 MG CAP: 150 | 7 days supply | Qty: 28 | Fill #0

## 2015-07-09 NOTE — ED Notes (Signed)
Patient states she developed a hard lump on her posterior left upper arm which was associated with intense itching.  States she was here last night and had the area marked.  States today the area is less itchy and warm.

## 2015-07-09 NOTE — ED Provider Notes (Signed)
CSN: [id number]     Arrival date & time 07/09/15  4098 History   First MD Initiated Contact with Patient 07/09/15 [phone]     Chief Complaint  Patient presents with  . Recurrent Skin Infections     (Consider location/radiation/quality/duration/timing/severity/associated sxs/prior Treatment) HPI Comments: Patient is a 53 rolled female with history of MRSA and recurrent abscesses and cellulitis. Patient was seen approximately 9 days ago for cellulitis and possible forming abscess. She was started on Bactrim which she finished 4 days ago. She states that all symptoms had resolved and then yesterday started to have an area on her left posterior upper arm of warmth, redness, itching which is the typical presentation of recurrent cellulitis for her. She denies fever or systemic symptoms. She was seen in the emergency room last night but waited 3 hours and had to go to work. The area was marked she returned today for further evaluation. The area has not spread the marking but she still complains of warmth and redness.  The history is provided by the patient.    Past Medical History  Diagnosis Date  . MRSA (methicillin resistant Staphylococcus aureus)   . Hypertension    Past Surgical History  Procedure Laterality Date  . Tonsillectomy     No family history on file. Social History  Substance Use Topics  . Smoking status: Current Every Day Smoker  . Smokeless tobacco: None  . Alcohol Use: Yes   OB History    No data available     Review of Systems  All other systems reviewed and are negative.     Allergies  Review of patient's allergies indicates no known allergies.  Home Medications   Prior to Admission medications   Medication Sig Start Date End Date Taking? Authorizing Provider  amoxicillin-clavulanate (AUGMENTIN) 875-125 MG per tablet Take 1 tablet by mouth 2 (two) times daily. One po bid x 7 days 07/27/14   Blane Ohara, MD  B Complex-C (B-COMPLEX WITH VITAMIN C) tablet Take  1 tablet by mouth daily.    Historical Provider, MD  clindamycin (CLEOCIN) 150 MG capsule Take 1 capsule (150 mg total) by mouth every 6 (six) hours. 07/09/15   Gwyneth Sprout, MD  fluconazole (DIFLUCAN) 200 MG tablet Take 1 tablet (200 mg total) by mouth daily. 07/09/15 07/16/15  Gwyneth Sprout, MD  HYDROcodone-acetaminophen (NORCO) 5-325 MG per tablet Take 2 tablets by mouth every 4 (four) hours as needed. 07/27/14   Blane Ohara, MD  hydrocortisone cream 1 % Apply to affected area 2 times daily 06/30/15   Barrett Henle, PA-C  Multiple Vitamin (MULITIVITAMIN WITH MINERALS) TABS Take 1 tablet by mouth daily.    Historical Provider, MD  naproxen (NAPROSYN) 500 MG tablet Take 1 tablet (500 mg total) by mouth 2 (two) times daily as needed. 07/27/14   Blane Ohara, MD   BP 131/79 mmHg  Pulse 71  Temp(Src) 98.5 F (36.9 C) (Oral)  Resp 18  Ht  (1.727 m)  Wt 232 lb (105.235 kg)  BMI 35.28 kg/m2  SpO2 100%  LMP 05/15/2015 Physical Exam  Constitutional: She is oriented to person, place, and time. She appears well-developed and well-nourished. No distress.  HENT:  Head: Normocephalic and atraumatic.  Eyes: EOM are normal. Pupils are equal, round, and reactive to light.  Cardiovascular: Normal rate.   Pulmonary/Chest: Effort normal.  Musculoskeletal: She exhibits no tenderness.  Neurological: She is alert and oriented to person, place, and time.  Skin: There is erythema.  Psychiatric: She has a normal mood and affect. Her behavior is normal.  Nursing note and vitals reviewed.   ED Course  Procedures (including critical care time) Labs Review Labs Reviewed - No data to display  Imaging Review No results found. I have personally reviewed and evaluated these images and lab results as part of my medical decision-making.   EKG Interpretation None      MDM   Final diagnoses:  Cellulitis of left upper extremity  Vaginal candida    Patient returning today with  recurrent cellulitis of her left arm. She has a history of MRSA and intermittent abscesses some requiring I&D. She completed a course of Bactrim a proximally 4 days ago with symptom resolution but now recurrent symptoms. She has no systemic symptoms and no palpable area for abscess formation. At this time will start clindamycin. Also patient is complaining of a yeast infection and patient was given Diflucan.    Gwyneth Sprout, MD 07/09/15 [id number]

## 2015-07-09 NOTE — Discharge Instructions (Signed)
Cellulitis °Cellulitis is an infection of the skin and the tissue beneath it. The infected area is usually red and tender. Cellulitis occurs most often in the arms and lower legs.  °CAUSES  °Cellulitis is caused by bacteria that enter the skin through cracks or cuts in the skin. The most common types of bacteria that cause cellulitis are staphylococci and streptococci. °SIGNS AND SYMPTOMS  °· Redness and warmth. °· Swelling. °· Tenderness or pain. °· Fever. °DIAGNOSIS  °Your health care provider can usually determine what is wrong based on a physical exam. Blood tests may also be done. °TREATMENT  °Treatment usually involves taking an antibiotic medicine. °HOME CARE INSTRUCTIONS  °· Take your antibiotic medicine as directed by your health care provider. Finish the antibiotic even if you start to feel better. °· Keep the infected arm or leg elevated to reduce swelling. °· Apply a warm cloth to the affected area up to 4 times per day to relieve pain. °· Take medicines only as directed by your health care provider. °· Keep all follow-up visits as directed by your health care provider. °SEEK MEDICAL CARE IF:  °· You notice red streaks coming from the infected area. °· Your red area gets larger or turns dark in color. °· Your bone or joint underneath the infected area becomes painful after the skin has healed. °· Your infection returns in the same area or another area. °· You notice a swollen bump in the infected area. °· You develop new symptoms. °· You have a fever. °SEEK IMMEDIATE MEDICAL CARE IF:  °· You feel very sleepy. °· You develop vomiting or diarrhea. °· You have a general ill feeling (malaise) with muscle aches and pains. °  °This information is not intended to replace advice given to you by your health care provider. Make sure you discuss any questions you have with your health care provider. °  °Document Released: 03/10/2005 Document Revised: 02/19/2015 Document Reviewed: 08/16/2011 °Elsevier Interactive  Patient Education ©2016 [employer] ° ° °Probiotics  °WHAT ARE PROBIOTICS?  °Probiotics are the good bacteria and yeasts that live in your body and keep you and your digestive system healthy. Probiotics also help your body's defense (immune) system and protect your body against bad bacterial growth.  °Certain foods contain probiotics, such as yogurt. Probiotics can also be purchased as a supplement. As with any supplement or drug, it is important to discuss its use with your health care provider.  °WHAT AFFECTS THE BALANCE OF BACTERIA IN MY BODY?  °The balance of bacteria in your body can be affected by:  °Antibiotic medicines. Antibiotics are sometimes necessary to treat infection. Unfortunately, they may kill good or friendly bacteria in your body as well as the bad bacteria. This may lead to stomach problems like diarrhea, gas, and cramping.  °Disease. Some conditions are the result of an overgrowth of bad bacteria, yeasts, parasites, or fungi. These conditions include:  °Infectious diarrhea.  °Stomach and respiratory infections.  °Skin infections.  °Irritable bowel syndrome (IBS).  °Inflammatory bowel diseases.  °Ulcer due to Helicobacter pylori (H. pylori) infection.  °Tooth decay and periodontal disease.  °Vaginal infections. °Stress and poor diet may also lower the good bacteria in your body.  °WHAT TYPE OF PROBIOTIC IS RIGHT FOR ME?  °Probiotics are available over the counter at your local pharmacy, health food, or grocery store. They come in many different forms, combinations of strains, and dosing strengths. Some may need to be refrigerated. Always read the label for storage   Specific strains have been shown to be more effective for certain conditions. Ask your health care provider what option is best for you.  WHY WOULD I NEED PROBIOTICS? There are many reasons your health care provider might recommend a probiotic supplement, including:    Diarrhea.  Constipation.  IBS.  Respiratory infections.  Yeast infections.  Acne, eczema, and other skin conditions.  Frequent urinary tract infections (UTIs). ARE THERE SIDE EFFECTS OF PROBIOTICS? Some people experience mild side effects when taking probiotics. Side effects are usually temporary and may include:   Gas.  Bloating.  Cramping. Rarely, serious side effects, such as infection or immune system changes, may occur. WHAT ELSE DO I NEED TO KNOW ABOUT PROBIOTICS?   There are many different strains of probiotics. Certain strains may be more effective depending on your condition. Probiotics are available in varying doses. Ask your health care provider which probiotic you should use and how often.   If you are taking probiotics along with antibiotics, it is generally recommended to wait at least 2 hours between taking the antibiotic and taking the probiotic.  FOR MORE INFORMATION:  [facility] [email or web address]   This information is not intended to replace advice given to you by your health care provider. Make sure you discuss any questions you have with your health care provider.   Document Released: 12/26/2013 Document Reviewed: 12/26/2013 Elsevier Interactive Patient Education [employer]

## 2015-08-09 ENCOUNTER — Emergency Department (HOSPITAL_BASED_OUTPATIENT_CLINIC_OR_DEPARTMENT_OTHER)
Admission: EM | Admit: 2015-08-09 | Discharge: 2015-08-09 | Disposition: A | Discharge status: Home / Self Care | Payer: Worker's Compensation | Attending: Emergency Medicine | Admitting: Emergency Medicine

## 2015-08-09 ENCOUNTER — Encounter (HOSPITAL_BASED_OUTPATIENT_CLINIC_OR_DEPARTMENT_OTHER): Payer: Self-pay | Admitting: Emergency Medicine

## 2015-08-09 ENCOUNTER — Emergency Department (HOSPITAL_BASED_OUTPATIENT_CLINIC_OR_DEPARTMENT_OTHER): Payer: Worker's Compensation

## 2015-08-09 DIAGNOSIS — Z8614 Personal history of Methicillin resistant Staphylococcus aureus infection: Secondary | ICD-10-CM | POA: Diagnosis not present

## 2015-08-09 DIAGNOSIS — Y9289 Other specified places as the place of occurrence of the external cause: Secondary | ICD-10-CM | POA: Insufficient documentation

## 2015-08-09 DIAGNOSIS — Y990 Civilian activity done for income or pay: Secondary | ICD-10-CM | POA: Insufficient documentation

## 2015-08-09 DIAGNOSIS — I10 Essential (primary) hypertension: Secondary | ICD-10-CM | POA: Insufficient documentation

## 2015-08-09 DIAGNOSIS — S46912A Strain of unspecified muscle, fascia and tendon at shoulder and upper arm level, left arm, initial encounter: Secondary | ICD-10-CM | POA: Diagnosis not present

## 2015-08-09 DIAGNOSIS — Z79899 Other long term (current) drug therapy: Secondary | ICD-10-CM | POA: Diagnosis not present

## 2015-08-09 DIAGNOSIS — S4992XA Unspecified injury of left shoulder and upper arm, initial encounter: Secondary | ICD-10-CM | POA: Diagnosis present

## 2015-08-09 DIAGNOSIS — F17200 Nicotine dependence, unspecified, uncomplicated: Secondary | ICD-10-CM | POA: Insufficient documentation

## 2015-08-09 DIAGNOSIS — Y9389 Activity, other specified: Secondary | ICD-10-CM | POA: Insufficient documentation

## 2015-08-09 DIAGNOSIS — X500XXA Overexertion from strenuous movement or load, initial encounter: Secondary | ICD-10-CM | POA: Diagnosis not present

## 2015-08-09 NOTE — ED Notes (Signed)
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

## 2015-08-09 NOTE — Discharge Instructions (Signed)
You can use ibuprofen, available over the counter, for pain.  Shoulder Range of Motion Exercises Shoulder range of motion (ROM) exercises are designed to keep the shoulder moving freely. They are often recommended for people who have shoulder pain. MOVEMENT EXERCISE When you are able, do this exercise 5-6 days per week, or as told by your health care provider. Work toward doing 2 sets of 10 swings. Pendulum Exercise How To Do This Exercise Lying Down 1. Lie face-down on a bed with your abdomen close to the side of the bed. 2. Let your arm hang over the side of the bed. 3. Relax your shoulder, arm, and hand. 4. Slowly and gently swing your arm forward and back. Do not use your neck muscles to swing your arm. They should be relaxed. If you are struggling to swing your arm, have someone gently swing it for you. When you do this exercise for the first time, swing your arm at a 15 degree angle for 15 seconds, or swing your arm 10 times. As pain lessens over time, increase the angle of the swing to 30-45 degrees. 5. Repeat steps 1-4 with the other arm. How To Do This Exercise While Standing 1. Stand next to a sturdy chair or table and hold on to it with your hand.  Bend forward at the waist.  Bend your knees slightly.  Relax your other arm and let it hang limp.  Relax the shoulder blade of the arm that is hanging and let it drop.  While keeping your shoulder relaxed, use body motion to swing your arm in small circles. The first time you do this exercise, swing your arm for about 30 seconds or 10 times. When you do it next time, swing your arm for a little longer.  Stand up tall and relax.  Repeat steps 1-7, this time changing the direction of the circles. 2. Repeat steps 1-8 with the other arm. STRETCHING EXERCISES Do these exercises 3-4 times per day on 5-6 days per week or as told by your health care provider. Work toward holding the stretch for 20 seconds. Stretching Exercise 1 1. Lift  your arm straight out in front of you. 2. Bend your arm 90 degrees at the elbow (right angle) so your forearm goes across your body and looks like the letter "L." 3. Use your other arm to gently pull the elbow forward and across your body. 4. Repeat steps 1-3 with the other arm. Stretching Exercise 2 You will need a towel or rope for this exercise. 1. Bend one arm behind your back with the palm facing outward. 2. Hold a towel with your other hand. 3. Reach the arm that holds the towel above your head, and bend that arm at the elbow. Your wrist should be behind your neck. 4. Use your free hand to grab the free end of the towel. 5. With the higher hand, gently pull the towel up behind you. 6. With the lower hand, pull the towel down behind you. 7. Repeat steps 1-6 with the other arm. STRENGTHENING EXERCISES Do each of these exercises at four different times of day (sessions) every day or as told by your health care provider. To begin with, repeat each exercise 5 times (repetitions). Work toward doing 3 sets of 12 repetitions or as told by your health care provider. Strengthening Exercise 1 You will need a light weight for this activity. As you grow stronger, you may use a heavier weight. 1. Standing with a weight  in your hand, lift your arm straight out to the side until it is at the same height as your shoulder. 2. Bend your arm at 90 degrees so that your fingers are pointing to the ceiling. 3. Slowly raise your hand until your arm is straight up in the air. 4. Repeat steps 1-3 with the other arm. Strengthening Exercise 2 You will need a light weight for this activity. As you grow stronger, you may use a heavier weight. 1. Standing with a weight in your hand, gradually move your straight arm in an arc, starting at your side, then out in front of you, then straight up over your head. 2. Gradually move your other arm in an arc, starting at your side, then out in front of you, then straight up  over your head. 3. Repeat steps 1-2 with the other arm. Strengthening Exercise 3 You will need an elastic band for this activity. As you grow stronger, gradually increase the size of the bands or increase the number of bands that you use at one time. 1. While standing, hold an elastic band in one hand and raise that arm up in the air. 2. With your other hand, pull down the band until that hand is by your side. 3. Repeat steps 1-2 with the other arm.   This information is not intended to replace advice given to you by your health care provider. Make sure you discuss any questions you have with your health care provider.   Document Released: 02/27/2003 Document Revised: 10/15/2014 Document Reviewed: 05/27/2014 Elsevier Interactive Patient Education [employer]

## 2015-08-09 NOTE — ED Provider Notes (Signed)
CSN: [id number]     Arrival date & time 08/09/15  1806 History  By signing my name below, I, Adrienne Roberts, attest that this documentation has been prepared under the direction and in the presence of Tilden Fossa, MD. Electronically Signed: Bethel Roberts, ED Scribe. 08/09/2015. 7:56 PM     Chief Complaint  Patient presents with  . Shoulder Pain   The history is provided by the patient. No language interpreter was used.   Adrienne Roberts is a 35 y.o. right-hand dominant female who presents to the Emergency Department complaining of new and constant left shoulder pain with onset earlier today. Pt states that she lifted a child at work yesterday and felt a "snap" but the pain did not start until today.  Denies previous injury to the shoulder.  Pt also denies chest pain and SOB .    Past Medical History  Diagnosis Date  . MRSA (methicillin resistant Staphylococcus aureus)   . Hypertension    Past Surgical History  Procedure Laterality Date  . Tonsillectomy     History reviewed. No pertinent family history. Social History  Substance Use Topics  . Smoking status: Current Every Day Smoker  . Smokeless tobacco: None  . Alcohol Use: Yes   OB History    No data available     Review of Systems  Respiratory: Negative for shortness of breath.   Cardiovascular: Negative for chest pain.  Musculoskeletal:       Left shoulder pain   All other systems reviewed and are negative.  Allergies  Review of patient's allergies indicates no known allergies.  Home Medications   Prior to Admission medications   Medication Sig Start Date End Date Taking? Authorizing Provider  B Complex-C (B-COMPLEX WITH VITAMIN C) tablet Take 1 tablet by mouth daily.    Historical Provider, MD  Multiple Vitamin (MULITIVITAMIN WITH MINERALS) TABS Take 1 tablet by mouth daily.    Historical Provider, MD   BP 126/88 mmHg  Pulse 88  Temp(Src) 98.3 F (36.8 C) (Oral)  Resp 22  Ht  (1.727 m)  Wt 230 lb  (104.327 kg)  BMI 34.98 kg/m2  SpO2 100%  LMP 08/08/2015 Physical Exam  Constitutional: She is oriented to person, place, and time. She appears well-developed and well-nourished. No distress.  HENT:  Head: Normocephalic and atraumatic.  Cardiovascular: Normal rate and regular rhythm.   Pulmonary/Chest: Effort normal. No respiratory distress.  Musculoskeletal:  2+ radial pulses in bilateral upper extremities. No clavicular or chest wall tenderness to palpation. There is tenderness over the lateral shoulder that appears to be predominantly muscular in nature. There is pain with abduction to 90.   Neurological: She is alert and oriented to person, place, and time.  5 out of 5 grip strength in bilateral upper extremities. Sensation to light touch intact throughout bilateral upper extremities  Skin: Skin is warm and dry.  Psychiatric: She has a normal mood and affect. Her behavior is normal.  Nursing note and vitals reviewed.   ED Course  Procedures (including critical care time) DIAGNOSTIC STUDIES: Oxygen Saturation is 100% on RA,  normal by my interpretation.    COORDINATION OF CARE: 7:55 PM Discussed treatment plan which includes left shoulder pain with pt at bedside and pt agreed to plan.  Labs Review Labs Reviewed - No data to display  Imaging Review Dg Shoulder Left  08/09/2015  CLINICAL DATA:  Patient felt pop in left shoulder. Severe left shoulder pain. Limited range of motion. Initial  encounter. EXAM: LEFT SHOULDER - 2+ VIEW COMPARISON:  None. FINDINGS: There is no evidence of fracture or dislocation. There is no evidence of arthropathy or other focal bone abnormality. Soft tissues are unremarkable. IMPRESSION: Negative. Electronically Signed   By: Annia Belt M.D.   On: 08/09/2015 19:10   I have personally reviewed and evaluated these images as part of my medical decision-making.   EKG Interpretation None      MDM   Final diagnoses:  Shoulder strain, left, initial  encounter   Patient here for evaluation of left shoulder pain following an injury yesterday. She is neurovascularly intact on examination and able to range the shoulder. Concern for muscular strain. Discussed home care with NSAIDs, range of motion activities, outpatient follow-up. Return precautions were discussed.  I personally performed the services described in this documentation, which was scribed in my presence. The recorded information has been reviewed and is accurate.    Tilden Fossa, MD 08/09/15 2329

## 2015-08-10 ENCOUNTER — Emergency Department (HOSPITAL_BASED_OUTPATIENT_CLINIC_OR_DEPARTMENT_OTHER)
Admission: EM | Admit: 2015-08-10 | Discharge: 2015-08-10 | Disposition: A | Discharge status: Home / Self Care | Payer: Worker's Compensation | Attending: Emergency Medicine | Admitting: Emergency Medicine

## 2015-08-10 ENCOUNTER — Encounter (HOSPITAL_BASED_OUTPATIENT_CLINIC_OR_DEPARTMENT_OTHER): Payer: Self-pay | Admitting: *Deleted

## 2015-08-10 DIAGNOSIS — Z79899 Other long term (current) drug therapy: Secondary | ICD-10-CM | POA: Diagnosis not present

## 2015-08-10 DIAGNOSIS — M75102 Unspecified rotator cuff tear or rupture of left shoulder, not specified as traumatic: Secondary | ICD-10-CM

## 2015-08-10 DIAGNOSIS — F17200 Nicotine dependence, unspecified, uncomplicated: Secondary | ICD-10-CM | POA: Insufficient documentation

## 2015-08-10 DIAGNOSIS — Y998 Other external cause status: Secondary | ICD-10-CM | POA: Diagnosis not present

## 2015-08-10 DIAGNOSIS — Y9389 Activity, other specified: Secondary | ICD-10-CM | POA: Insufficient documentation

## 2015-08-10 DIAGNOSIS — Y92210 Daycare center as the place of occurrence of the external cause: Secondary | ICD-10-CM | POA: Diagnosis not present

## 2015-08-10 DIAGNOSIS — X500XXA Overexertion from strenuous movement or load, initial encounter: Secondary | ICD-10-CM | POA: Diagnosis not present

## 2015-08-10 DIAGNOSIS — Z8614 Personal history of Methicillin resistant Staphylococcus aureus infection: Secondary | ICD-10-CM | POA: Insufficient documentation

## 2015-08-10 DIAGNOSIS — S4992XA Unspecified injury of left shoulder and upper arm, initial encounter: Secondary | ICD-10-CM | POA: Diagnosis present

## 2015-08-10 DIAGNOSIS — I10 Essential (primary) hypertension: Secondary | ICD-10-CM | POA: Diagnosis not present

## 2015-08-10 MED ORDER — HYDROCODONE-ACETAMINOPHEN 5-325 MG PO TABS: 1.0000 | ORAL_TABLET | Freq: Four times a day (QID) | ORAL | Status: DC | PRN

## 2015-08-10 NOTE — Discharge Instructions (Signed)
Rotator Cuff Injury °Rotator cuff injury is any type of injury to the set of muscles and tendons that make up the stabilizing unit of your shoulder. This unit holds the ball of your upper arm bone (humerus) in the socket of your shoulder blade (scapula).  °CAUSES °Injuries to your rotator cuff most commonly come from sports or activities that cause your arm to be moved repeatedly over your head. Examples of this include throwing, weight lifting, swimming, or racquet sports. Long lasting (chronic) irritation of your rotator cuff can cause soreness and swelling (inflammation), bursitis, and eventual damage to your tendons, such as a tear (rupture). °SIGNS AND SYMPTOMS °Acute rotator cuff tear: °· Sudden tearing sensation followed by severe pain shooting from your upper shoulder down your arm toward your elbow. °· Decreased range of motion of your shoulder because of pain and muscle spasm. °· Severe pain. °· Inability to raise your arm out to the side because of pain and loss of muscle power (large tears). °Chronic rotator cuff tear: °· Pain that usually is worse at night and may interfere with sleep. °· Gradual weakness and decreased shoulder motion as the pain worsens. °· Decreased range of motion. °Rotator cuff tendinitis:  °· Deep ache in your shoulder and the outside upper arm over your shoulder. °· Pain that comes on gradually and becomes worse when lifting your arm to the side or turning it inward. °DIAGNOSIS °Rotator cuff injury is diagnosed through a medical history, physical exam, and imaging exam. The medical history helps determine the type of rotator cuff injury. Your health care provider will look at your injured shoulder, feel the injured area, and ask you to move your shoulder in different positions. X-ray exams typically are done to rule out other causes of shoulder pain, such as fractures. MRI is the exam of choice for the most severe shoulder injuries because the images show muscles and tendons.    °TREATMENT  °Chronic tear: °· Medicine for pain, such as acetaminophen or ibuprofen. °· Physical therapy and range-of-motion exercises may be helpful in maintaining shoulder function and strength. °· Steroid injections into your shoulder joint. °· Surgical repair of the rotator cuff if the injury does not heal with noninvasive treatment. °Acute tear: °· Anti-inflammatory medicines such as ibuprofen and naproxen to help reduce pain and swelling. °· A sling to help support your arm and rest your rotator cuff muscles. Long-term use of a sling is not advised. It may cause significant stiffening of the shoulder joint. °· Surgery may be considered within a few weeks, especially in younger, active people, to return the shoulder to full function. °· Indications for surgical treatment include the following: °¨ Age younger than 60 years. °¨ Rotator cuff tears that are complete. °¨ Physical therapy, rest, and anti-inflammatory medicines have been used for 6-8 weeks, with no improvement. °¨ Employment or sporting activity that requires constant shoulder use. °Tendinitis: °· Anti-inflammatory medicines such as ibuprofen and naproxen to help reduce pain and swelling. °· A sling to help support your arm and rest your rotator cuff muscles. Long-term use of a sling is not advised. It may cause significant stiffening of the shoulder joint. °· Severe tendinitis may require: °¨ Steroid injections into your shoulder joint. °¨ Physical therapy. °¨ Surgery. °HOME CARE INSTRUCTIONS  °· Apply ice to your injury: °¨ Put ice in a plastic bag. °¨ Place a towel between your skin and the bag. °¨ Leave the ice on for 20 minutes, 2-3 times a day. °· If you   have a shoulder immobilizer (sling and straps), wear it until told otherwise by your health care provider. °· You may want to sleep on several pillows or in a recliner at night to lessen swelling and pain. °· Only take over-the-counter or prescription medicines for pain, discomfort, or fever as  directed by your health care provider. °· Do simple hand squeezing exercises with a soft rubber ball to decrease hand swelling. °SEEK MEDICAL CARE IF:  °· Your shoulder pain increases, or new pain or numbness develops in your arm, hand, or fingers. °· Your hand or fingers are colder than your other hand. °SEEK IMMEDIATE MEDICAL CARE IF:  °· Your arm, hand, or fingers are numb or tingling. °· Your arm, hand, or fingers are increasingly swollen and painful, or they turn white or blue. °MAKE SURE YOU: °· Understand these instructions. °· Will watch your condition. °· Will get help right away if you are not doing well or get worse. °  °This information is not intended to replace advice given to you by your health care provider. Make sure you discuss any questions you have with your health care provider. °  °Document Released: 05/28/2000 Document Revised: 06/05/2013 Document Reviewed: 01/10/2013 °Elsevier Interactive Patient Education ©2016 [employer] ° °

## 2015-08-10 NOTE — ED Provider Notes (Signed)
CSN: [id number]     Arrival date & time 08/10/15  0401 History   First MD Initiated Contact with Patient 08/10/15 [phone]     Chief Complaint  Patient presents with  . 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 She felt and heard a pop in her left shoulder. There was no immediate pain but pain developed over the next few hours. She was seen yesterday for pain in her left shoulder. An x-ray was unremarkable and she was discharged home with ibuprofen. The pain is subsequently worsened and she cannot find a comfortable position. She has almost no range of motion of left shoulder due to pain and any attempted range of motion exacerbates the pain. There is no numbness or weakness in her left arm but she does feel like it is tight.  Past Medical History  Diagnosis Date  . MRSA (methicillin resistant Staphylococcus aureus)   . Hypertension    Past Surgical History  Procedure Laterality Date  . Tonsillectomy     No family history on file. Social History  Substance Use Topics  . Smoking status: Current Every Day Smoker  . Smokeless tobacco: None  . Alcohol Use: Yes     Comment: occasional    OB History    No data available     Review of Systems  All other systems reviewed and are negative.   Allergies  Review of patient's allergies indicates no known allergies.  Home Medications   Prior to Admission medications   Medication Sig Start Date End Date Taking? Authorizing Provider  B Complex-C (B-COMPLEX WITH VITAMIN C) tablet Take 1 tablet by mouth daily.    Historical Provider, MD  Multiple Vitamin (MULITIVITAMIN WITH MINERALS) TABS Take 1 tablet by mouth daily.    Historical Provider, MD   BP 132/85 mmHg  Pulse 75  Temp(Src) 97.7 F (36.5 C)  Resp 18  Ht  (1.727 m)  Wt 230 lb (104.327 kg)  BMI 34.98  kg/m2  SpO2 98%  LMP 08/08/2015   Physical Exam  General: Well-developed, well-nourished female in no acute distress; appearance consistent with age of record HENT: normocephalic; atraumatic Eyes: Normal appearance Neck: supple Heart: regular rate and rhythm Lungs: Normal respiratory effort and excursion Abdomen: soft; nondistended Extremities: No deformity; tenderness of left anterior shoulder with almost completely limited range of motion; left upper extremity neurovascularly intact Neurologic: Awake, alert and oriented; motor function intact in all extremities and symmetric; no facial droop Skin: Warm and dry Psychiatric: Tearful    ED Course  Procedures (including critical care time)   MDM  Nursing notes and vitals signs, including pulse oximetry, reviewed.  Summary of this visit's results, reviewed by myself:  Imaging Studies: Dg Shoulder Left  08/09/2015  CLINICAL DATA:  Patient felt pop in left shoulder. Severe left shoulder pain. Limited range of motion. Initial encounter. EXAM: LEFT SHOULDER - 2+ VIEW COMPARISON:  None. FINDINGS: There is no evidence of fracture or dislocation. There is no evidence of arthropathy or other focal bone abnormality. Soft tissues are unremarkable. IMPRESSION: Negative. Electronically Signed   By: Annia Belt M.D.   On: 08/09/2015 19:10   I suspect an acute rotator cuff year. She was referred to Dr. Pearletha Forge, but she may benefit from referral to the orthopedic surgeon on call, Dr. Eulah Pont.    Paula Libra, MD  08/10/15 0446 

## 2015-08-10 NOTE — ED Notes (Signed)
MD with pt  

## 2015-08-10 NOTE — ED Notes (Addendum)
Pt. States she was at work on Friday and lifted a child causing a "pop" to her left shoulder. States she did not have pain until Saturday morning.states she was seen here at [facility] but states that the pain is getting worse. States pain with movement. Pt states she did drive herself here today. Pt is tearful on exam. MD updated.

## 2015-11-26 ENCOUNTER — Encounter (HOSPITAL_BASED_OUTPATIENT_CLINIC_OR_DEPARTMENT_OTHER): Payer: Self-pay

## 2015-11-26 ENCOUNTER — Emergency Department (HOSPITAL_BASED_OUTPATIENT_CLINIC_OR_DEPARTMENT_OTHER)
Admission: EM | Admit: 2015-11-26 | Discharge: 2015-11-27 | Disposition: A | Discharge status: Home / Self Care | Payer: Managed Care, Other (non HMO) | Attending: Emergency Medicine | Admitting: Emergency Medicine

## 2015-11-26 DIAGNOSIS — N760 Acute vaginitis: Secondary | ICD-10-CM | POA: Diagnosis not present

## 2015-11-26 DIAGNOSIS — B3731 Acute candidiasis of vulva and vagina: Secondary | ICD-10-CM

## 2015-11-26 DIAGNOSIS — F17200 Nicotine dependence, unspecified, uncomplicated: Secondary | ICD-10-CM | POA: Insufficient documentation

## 2015-11-26 DIAGNOSIS — B373 Candidiasis of vulva and vagina: Secondary | ICD-10-CM | POA: Diagnosis not present

## 2015-11-26 DIAGNOSIS — B9689 Other specified bacterial agents as the cause of diseases classified elsewhere: Secondary | ICD-10-CM

## 2015-11-26 DIAGNOSIS — I10 Essential (primary) hypertension: Secondary | ICD-10-CM | POA: Diagnosis not present

## 2015-11-26 DIAGNOSIS — N898 Other specified noninflammatory disorders of vagina: Secondary | ICD-10-CM | POA: Diagnosis present

## 2015-11-26 LAB — URINALYSIS, ROUTINE W REFLEX MICROSCOPIC
Bilirubin Urine: NEGATIVE
Glucose, UA: NEGATIVE mg/dL
Hgb urine dipstick: NEGATIVE
Ketones, ur: NEGATIVE mg/dL
Nitrite: NEGATIVE
Protein, ur: NEGATIVE mg/dL
Specific Gravity, Urine: 1.025 (ref 1.005–1.030)
pH: 6 (ref 5.0–8.0)

## 2015-11-26 LAB — URINE MICROSCOPIC-ADD ON: RBC / HPF: NONE SEEN RBC/hpf (ref 0–5)

## 2015-11-26 LAB — PREGNANCY, URINE: Preg Test, Ur: NEGATIVE

## 2015-11-26 NOTE — ED Notes (Signed)
Pt c/o odorous vaginal discharge for the last three days and left heel burning for over a month with no known injury

## 2015-11-27 LAB — WET PREP, GENITAL
SPERM: NONE SEEN
Trich, Wet Prep: NONE SEEN

## 2015-11-27 LAB — CBG MONITORING, ED: GLUCOSE-CAPILLARY: 110 mg/dL — AB (ref 65–99)

## 2015-11-27 MED ORDER — METRONIDAZOLE 500 MG PO TABS: 500.0000 mg | ORAL_TABLET | Freq: Two times a day (BID) | ORAL | Status: DC

## 2015-11-27 MED ORDER — FLUCONAZOLE 50 MG PO TABS
150.0000 mg | ORAL_TABLET | Freq: Once | ORAL | Status: AC
  Administered 2015-11-27: 150 mg via ORAL
  Filled 2015-11-27: qty 1

## 2015-11-27 NOTE — ED Provider Notes (Signed)
CSN: [id number]     Arrival date & time 11/26/15  2155 History   First MD Initiated Contact with Patient 11/27/15 0003     Chief Complaint  Patient presents with  . Vaginal Discharge     (Consider location/radiation/quality/duration/timing/severity/associated sxs/prior Treatment) HPI  This is a 35 year old female with a three-day history of a malodorous vaginal discharge. The discharge is thick. She is also having a moderate burning discomfort in her vagina. This is exacerbated by urination and she has been urinating frequently possibly due to increased fluid intake. She denies fever, chills, nausea, vomiting or diarrhea.   Past Medical History  Diagnosis Date  . MRSA (methicillin resistant Staphylococcus aureus)   . Hypertension    Past Surgical History  Procedure Laterality Date  . Tonsillectomy     No family history on file. Social History  Substance Use Topics  . Smoking status: Current Every Day Smoker  . Smokeless tobacco: None  . Alcohol Use: Yes     Comment: occasional    OB History    No data available     Review of Systems  All other systems reviewed and are negative.   Allergies  Review of patient's allergies indicates no known allergies.  Home Medications   Prior to Admission medications   Medication Sig Start Date End Date Taking? Authorizing Provider  B Complex-C (B-COMPLEX WITH VITAMIN C) tablet Take 1 tablet by mouth daily.    Historical Provider, MD  HYDROcodone-acetaminophen (NORCO/VICODIN) 5-325 MG tablet Take 1-2 tablets by mouth every 6 (six) hours as needed (for pain). 08/10/15   Honor Fairbank, MD  Multiple Vitamin (MULITIVITAMIN WITH MINERALS) TABS Take 1 tablet by mouth daily.    Historical Provider, MD   BP 117/78 mmHg  Pulse 62  Temp(Src) 98.8 F (37.1 C) (Oral)  Resp 18  Ht 5\' 8"  (1.727 m)  Wt 240 lb (108.863 kg)  BMI 36.50 kg/m2  SpO2 99%  LMP 11/09/2015   Physical Exam  General: Well-developed, well-nourished female in no acute  distress; appearance consistent with age of record HENT: normocephalic; atraumatic Eyes: pupils equal, round and reactive to light; extraocular muscles intact Neck: supple Heart: regular rate and rhythm Lungs: clear to auscultation bilaterally Abdomen: soft; nondistended; mild suprapubic tenderness; no masses or hepatosplenomegaly; bowel sounds present GU: No CVA tenderness; erythematous vulvovaginal mucosa with white curd-like discharge; burning discomfort on bimanual exam but no frank cervical motion tenderness or adnexal tenderness Extremities: No deformity; full range of motion; pulses normal Neurologic: Awake, alert and oriented; motor function intact in all extremities and symmetric; no facial droop Skin: Warm and dry Psychiatric: Normal mood and affect    ED Course  Procedures (including critical care time)   MDM   Nursing notes and vitals signs, including pulse oximetry, reviewed.  Summary of this visit's results, reviewed by myself:  Labs:  Results for orders placed or performed during the hospital encounter of 11/26/15 (from the past 24 hour(s))  Pregnancy, urine     Status: None   Collection Time: 11/26/15 10:00 PM  Result Value Ref Range   Preg Test, Ur NEGATIVE NEGATIVE  Urinalysis, Routine w reflex microscopic (not at [facility])     Status: Abnormal   Collection Time: 11/26/15 10:00 PM  Result Value Ref Range   Color, Urine YELLOW YELLOW   APPearance CLOUDY (A) CLEAR   Specific Gravity, Urine 1.025 1.005 - 1.030   pH 6.0 5.0 - 8.0   Glucose, UA NEGATIVE NEGATIVE mg/dL   Hgb  urine dipstick NEGATIVE NEGATIVE   Bilirubin Urine NEGATIVE NEGATIVE   Ketones, ur NEGATIVE NEGATIVE mg/dL   Protein, ur NEGATIVE NEGATIVE mg/dL   Nitrite NEGATIVE NEGATIVE   Leukocytes, UA LARGE (A) NEGATIVE  Urine microscopic-add on     Status: Abnormal   Collection Time: 11/26/15 10:00 PM  Result Value Ref Range   Squamous Epithelial / LPF 6-30 (A) NONE SEEN   WBC, UA 6-30 0 - 5 WBC/hpf    RBC / HPF NONE SEEN 0 - 5 RBC/hpf   Bacteria, UA FEW (A) NONE SEEN   Urine-Other YEAST PRESENT   Wet prep, genital     Status: Abnormal   Collection Time: 11/27/15 12:10 AM  Result Value Ref Range   Yeast Wet Prep HPF POC PRESENT (A) NONE SEEN   Trich, Wet Prep NONE SEEN NONE SEEN   Clue Cells Wet Prep HPF POC PRESENT (A) NONE SEEN   WBC, Wet Prep HPF POC MANY (A) NONE SEEN   Sperm NONE SEEN   CBG monitoring, ED     Status: Abnormal   Collection Time: 11/27/15 12:17 AM  Result Value Ref Range   Glucose-Capillary 110 (H) 65 - 99 mg/dL   Examination consistent with candidal vulvovaginitis. Urine sent for culture. We'll also treat for bacterial vaginosis this patient has clue cells present and has noted an abnormal vaginal odor.    Paula Libra, MD 11/27/15 [phone]

## 2015-11-28 LAB — URINE CULTURE

## 2015-11-28 LAB — GC/CHLAMYDIA PROBE AMP (~~LOC~~) NOT AT ARMC
CHLAMYDIA, DNA PROBE: NEGATIVE
Neisseria Gonorrhea: NEGATIVE

## 2016-01-16 ENCOUNTER — Encounter: Payer: Self-pay | Admitting: Podiatry

## 2016-01-16 ENCOUNTER — Ambulatory Visit (INDEPENDENT_AMBULATORY_CARE_PROVIDER_SITE_OTHER): Payer: Managed Care, Other (non HMO) | Admitting: Podiatry

## 2016-01-16 ENCOUNTER — Ambulatory Visit (INDEPENDENT_AMBULATORY_CARE_PROVIDER_SITE_OTHER): Payer: Managed Care, Other (non HMO)

## 2016-01-16 VITALS — BP 139/87 | HR 69 | Resp 12

## 2016-01-16 DIAGNOSIS — M722 Plantar fascial fibromatosis: Secondary | ICD-10-CM

## 2016-01-16 MED ORDER — MELOXICAM 15 MG PO TABS: 15.0000 mg | ORAL_TABLET | Freq: Every day | ORAL | 2 refills | Status: DC

## 2016-01-16 NOTE — Progress Notes (Signed)
   Subjective:    Patient ID: Adrienne Roberts, female    DOB: Oct 26, 1980, 35 y.o.   MRN: [id number]  HPI 35 year old female presents the office of consent left heel pain which is been ongoing for about 1 year. She states that she has pain in the morning and she first gets up or after standing all day. She denies any recent injury or trauma. No swelling or redness. No numbness or tingling. She describes as a throbbing sensation. The pain does not wake her at night. No other complaints at this time.  Chief Complaint  Patient presents with  . Plantar Fasciitis    LT FOOT HEEL BEEN PAINFUL FOR 1 YEAR. FOOT IS WORSE WHEN PUTTING PRESSURE ON IT. TRIED NO TREATMENT.    Review of Systems  All other systems reviewed and are negative.      Objective:   Physical Exam General: AAO x3, NAD  Dermatological: Skin is warm, dry and supple bilateral. Nails x 10 are well manicured; remaining integument appears unremarkable at this time. There are no open sores, no preulcerative lesions, no rash or signs of infection present.  Vascular: Dorsalis Pedis artery and Posterior Tibial artery pedal pulses are 2/4 bilateral with immedate capillary fill time.  There is no pain with calf compression, swelling, warmth, erythema.   Neruologic: Grossly intact via light touch bilateral. Vibratory intact via tuning fork bilateral. Protective threshold with Semmes Wienstein monofilament intact to all pedal sites bilateral.   Musculoskeletal: Tenderness to palpation along the plantar medial tubercle of the calcaneus at the insertion of plantar fascia on the left foot. There is no pain along the course of the plantar fascia within the arch of the foot. Plantar fascia appears to be intact. There is no pain with lateral compression of the calcaneus or pain with vibratory sensation. There is no pain along the course or insertion of the achilles tendon. No other areas of tenderness to bilateral lower extremities. MMT 5/5.  Gait:  Unassisted, Nonantalgic.     Assessment & Plan:  35 year old female left heel pain, likely plantar fasciitis -Treatment options discussed including all alternatives, risks, and complications -Etiology of symptoms were discussed -X-rays were obtained and reviewed with the patient. No evidence of acute fracture. -Prescribed mobic. Discussed side effects of the medication and directed to stop if any are to occur and call the office.  -Patient elects to proceed with steroid injection into the left heel. Under sterile skin preparation, a total of 2.5cc of kenalog 10, 0.5% Marcaine plain, and 2% lidocaine plain were infiltrated into the symptomatic area without complication. A band-aid was applied. Patient tolerated the injection well without complication. Post-injection care with discussed with the patient. Discussed with the patient to ice the area over the next couple of days to help prevent a steroid flare.  -Plantar fascial brace dispensed -Discussed shoe gear modifications and orthotics -Stretching icing daily -Follow-up in 3 weeks or sooner if any problems arise. In the meantime, encouraged to call the office with any questions, concerns, change in symptoms.   Ovid Curd, DPM

## 2016-01-20 DIAGNOSIS — M722 Plantar fascial fibromatosis: Secondary | ICD-10-CM | POA: Insufficient documentation

## 2016-02-06 ENCOUNTER — Encounter: Payer: Self-pay | Admitting: Podiatry

## 2016-02-06 ENCOUNTER — Ambulatory Visit (INDEPENDENT_AMBULATORY_CARE_PROVIDER_SITE_OTHER): Payer: Managed Care, Other (non HMO) | Admitting: Podiatry

## 2016-02-06 VITALS — BP 127/79 | HR 72 | Resp 12

## 2016-02-06 DIAGNOSIS — M722 Plantar fascial fibromatosis: Secondary | ICD-10-CM

## 2016-02-06 MED ORDER — IBUPROFEN 600 MG PO TABS
600.0000 mg | ORAL_TABLET | Freq: Three times a day (TID) | ORAL | 0 refills | Status: DC | PRN
Start: 2016-02-06 — End: 2016-03-12

## 2016-02-09 NOTE — Progress Notes (Signed)
Subjective: Adrienne Roberts presents to the office today for follow-up evaluation of left heel pain. They state that they are doing better but still having some pain. She did not take the meloxicam as those cuts and headaches. They have been icing, stretching, try to wear supportive shoe as much as possible. No other complaints at this time. No acute changes since last appointment. They deny any systemic complaints such as fevers, chills, nausea, vomiting.  Objective: General: AAO x3, NAD  Dermatological: Skin is warm, dry and supple bilateral. There are no open sores, no preulcerative lesions, no rash or signs of infection present.  Vascular: Dorsalis Pedis artery and Posterior Tibial artery pedal pulses are 2/4 bilateral with immedate capillary fill time. There is no pain with calf compression, swelling, warmth, erythema.   Neruologic: Grossly intact via light touch bilateral. Vibratory intact via tuning fork bilateral. Protective threshold with Semmes Wienstein monofilament intact to all pedal sites bilateral.   Musculoskeletal: There is improved but continued tenderness palpation along the plantar medial tubercle of the calcaneus at the insertion of the plantar fascia on the left foot. There is no pain along the course of the plantar fascia within the arch of the foot. Plantar fascia appears to be intact bilaterally. There is no pain with lateral compression of the calcaneus and there is no pain with vibratory sensation. There is no pain along the course or insertion of the Achilles tendon. There are no other areas of tenderness to bilateral lower extremities. No gross boney pedal deformities bilateral. No pain, crepitus, or limitation noted with foot and ankle range of motion bilateral. Muscular strength 5/5 in all groups tested bilateral.  Gait: Unassisted, Nonantalgic.   Assessment: Presents for follow-up evaluation for heel pain, likely plantar fasciitis   Plan: -Treatment options  discussed including all alternatives, risks, and complications -Patient elects to proceed with steroid injection into the left heel. Under sterile skin preparation, a total of 2.5cc of kenalog 10, 0.5% Marcaine plain, and 2% lidocaine plain were infiltrated into the symptomatic area without complication. A band-aid was applied. Patient tolerated the injection well without complication. Post-injection care with discussed with the patient. Discussed with the patient to ice the area over the next couple of days to help prevent a steroid flare.  -Prescribed ibuprofen. She states that she's had no issues with this previously. -She states that she will likely get power steps next week. -Ice and stretching exercises on a daily basis. -Continue supportive shoe gear. -Follow-up as scheduled or sooner if any problems arise. In the meantime, encouraged to call the office with any questions, concerns, change in symptoms.   Ovid CurdMatthew Wagoner, DPM

## 2016-03-05 ENCOUNTER — Ambulatory Visit: Payer: Managed Care, Other (non HMO) | Admitting: Podiatry

## 2016-03-12 ENCOUNTER — Ambulatory Visit (INDEPENDENT_AMBULATORY_CARE_PROVIDER_SITE_OTHER): Payer: Managed Care, Other (non HMO) | Admitting: Podiatry

## 2016-03-12 ENCOUNTER — Encounter: Payer: Self-pay | Admitting: Podiatry

## 2016-03-12 DIAGNOSIS — M722 Plantar fascial fibromatosis: Secondary | ICD-10-CM

## 2016-03-12 MED ORDER — IBUPROFEN 600 MG PO TABS: 600.0000 mg | ORAL_TABLET | Freq: Three times a day (TID) | ORAL | 0 refills | Status: DC | PRN

## 2016-03-12 NOTE — Progress Notes (Signed)
Subjective: Adrienne Roberts presents to the office today for follow-up evaluation of left heel pain. She states that she still any pain to her heels and the injections last about 2 weeks before they wear off. She has not been stretching or icing she has not been taking the ibuprofen. She's been able to get the inserts. No other complains.  Objective: General: AAO x3, NAD  Dermatological: Skin is warm, dry and supple bilateral. There are no open sores, no preulcerative lesions, no rash or signs of infection present.  Vascular: Dorsalis Pedis artery and Posterior Tibial artery pedal pulses are 2/4 bilateral with immedate capillary fill time. There is no pain with calf compression, swelling, warmth, erythema.   Neruologic: Grossly intact via light touch bilateral. Vibratory intact via tuning fork bilateral. Protective threshold with Semmes Wienstein monofilament intact to all pedal sites bilateral.   Musculoskeletal: There is  continued tenderness palpation along the plantar medial tubercle of the calcaneus at the insertion of the plantar fascia on the left foot. There is no pain along the course of the plantar fascia within the arch of the foot. Plantar fascia appears to be intact bilaterally. There is no pain with lateral compression of the calcaneus and there is no pain with vibratory sensation. There is no pain along the course or insertion of the Achilles tendon. There are no other areas of tenderness to bilateral lower extremities. No gross boney pedal deformities bilateral. No pain, crepitus, or limitation noted with foot and ankle range of motion bilateral. Muscular strength 5/5 in all groups tested bilateral.  Gait: Unassisted, Nonantalgic.   Assessment: Presents for follow-up evaluation for heel pain, likely plantar fasciitis   Plan: -Treatment options discussed including all alternatives, risks, and complications -At this time she is continuing pain. She is going to get power steps today  to wear inside of her shoes are discussed shoe gear changes as well. She was started on any further steroid injection today. Dispensed night splint as well due to equinus. Continue with stretching and icing daily as well. I resent over the ibuprofen she has not been taking this. I see her back in 4-6 weeks or sooner if needed. Call any questions or concerns.  Ovid CurdMatthew Zailyn Thoennes, DPM

## 2016-04-23 ENCOUNTER — Ambulatory Visit: Payer: Managed Care, Other (non HMO) | Admitting: Podiatry

## 2016-11-04 ENCOUNTER — Ambulatory Visit (INDEPENDENT_AMBULATORY_CARE_PROVIDER_SITE_OTHER): Payer: 59

## 2016-11-04 ENCOUNTER — Ambulatory Visit (INDEPENDENT_AMBULATORY_CARE_PROVIDER_SITE_OTHER): Payer: 59 | Admitting: Podiatry

## 2016-11-04 DIAGNOSIS — M722 Plantar fascial fibromatosis: Secondary | ICD-10-CM

## 2016-11-04 NOTE — Patient Instructions (Signed)

## 2016-11-05 NOTE — Progress Notes (Signed)
Subjective: 36 year old female presents the also concerns of recurrent right heel pain, plantar fasciitis. She states that she was doing well until about a month ago she started to have recurrence of pain. She states it feels the same as it did previously. She is pain the morning when she gets up after activity. She denies any recent injury or trauma. She does relate a history about 2 weeks ago she had some swelling to both of her feet her last about 2 days. This was after she was on her feet a graduation she denies any pain or redness or warmth of the swelling did resolve it has not returned. Denies any systemic complaints such as fevers, chills, nausea, vomiting. No acute changes since last appointment, and no other complaints at this time.   Objective: AAO x3, NAD DP/PT pulses palpable bilaterally, CRT less than 3 seconds Tenderness to palpation along the plantar medial tubercle of the calcaneus at the insertion of plantar fascia on the right foot. There is no pain along the course of the plantar fascia within the arch of the foot. Plantar fascia appears to be intact. There is no pain with lateral compression of the calcaneus or pain with vibratory sensation. There is no pain along the course or insertion of the achilles tendon. No other areas of tenderness to bilateral lower extremities. Negative tinel sign There is no significant swelling to bilateral lower extremities.  No open lesions or pre-ulcerative lesions.  No pain with calf compression, swelling, warmth, erythema  Assessment:  Plan: -All treatment options discussed with the patient including all alternatives, risks, complications.  -Patient elects to proceed with steroid injection into the right heel. Under sterile skin preparation, a total of 2.5cc of kenalog 10, 0.5% Marcaine plain, and 2% lidocaine plain were infiltrated into the symptomatic area without complication. A band-aid was applied. Patient tolerated the injection well without  complication. Post-injection care with discussed with the patient. Discussed with the patient to ice the area over the next couple of days to help prevent a steroid flare.  -Plantar fascial brace -Stretching, icing daily. -Ibuprofen when necessary -RTC3 weeks or sooner if needed.  -Patient encouraged to call the office with any questions, concerns, change in symptoms.  Ovid CurdMatthew Wagoner, DPM

## 2016-11-25 ENCOUNTER — Ambulatory Visit (INDEPENDENT_AMBULATORY_CARE_PROVIDER_SITE_OTHER): Payer: 59 | Admitting: Podiatry

## 2016-11-25 ENCOUNTER — Encounter: Payer: Self-pay | Admitting: Podiatry

## 2016-11-25 DIAGNOSIS — M722 Plantar fascial fibromatosis: Secondary | ICD-10-CM

## 2016-11-25 MED ORDER — METHYLPREDNISOLONE 4 MG PO TBPK: ORAL_TABLET | ORAL | 0 refills | Status: DC

## 2016-11-25 MED ORDER — TRIAMCINOLONE ACETONIDE 10 MG/ML IJ SUSP
10.0000 mg | Freq: Once | INTRAMUSCULAR | Status: AC
  Administered 2016-11-25: 10 mg

## 2016-11-25 NOTE — Progress Notes (Signed)
Subjective: 36 year old female presents the office a. With vibration of right heel pain. She states the injection did help but did not last for long. She has continue with stretching, icing. She does that she has get a new job and she's been on her feet walking more. She denies any swelling or redness and the pain does not wake her up at night. No symptoms or tingling. Denies any systemic complaints such as fevers, chills, nausea, vomiting. No acute changes since last appointment, and no other complaints at this time.   Objective: AAO x3, NAD DP/PT pulses palpable bilaterally, CRT less than 3 seconds There is continued tenderness to palpation along the plantar medial tubercle of the calcaneus at the insertion of plantar fascia on the right foot. No pain on the left foot. There is no pain along the course of the plantar fascia within the arch of the foot. Plantar fascia appears to be intact. There is no pain with lateral compression of the calcaneus or pain with vibratory sensation. Mild discomfort along the Achilles tendon upon dorsiflexion and there is equinus present. Thompson test is negative. No other areas of tenderness to bilateral lower extremities. No open lesions or pre-ulcerative lesions.  No pain with calf compression, swelling, warmth, erythema.  Negative tinel sign  Assessment: Right heel pain, plantar fasciitis; Achilles tendinitis.  Plan: -All treatment options discussed with the patient including all alternatives, risks, complications.  -Patient elects to proceed with steroid injection into the right heel. Under sterile skin preparation, a total of 2.5cc of kenalog 10, 0.5% Marcaine plain, and 2% lidocaine plain were infiltrated into the symptomatic area without complication. A band-aid was applied. Patient tolerated the injection well without complication. Post-injection care with discussed with the patient. Discussed with the patient to ice the area over the next couple of days to  help prevent a steroid flare.  -Medrol dose pack -Rx for PT given for [facility] -Continue stretching, icing exercises daily. -Continue night splint.  -Patient encouraged to call the office with any questions, concerns, change in symptoms.   Ovid CurdMatthew Bryker Fletchall, DPM

## 2017-07-26 ENCOUNTER — Other Ambulatory Visit: Payer: Self-pay

## 2017-07-26 ENCOUNTER — Encounter (HOSPITAL_BASED_OUTPATIENT_CLINIC_OR_DEPARTMENT_OTHER): Payer: Self-pay | Admitting: Emergency Medicine

## 2017-07-26 ENCOUNTER — Emergency Department (HOSPITAL_BASED_OUTPATIENT_CLINIC_OR_DEPARTMENT_OTHER)
Admission: EM | Admit: 2017-07-26 | Discharge: 2017-07-26 | Disposition: A | Discharge status: Home / Self Care | Payer: BLUE CROSS/BLUE SHIELD | Attending: Emergency Medicine | Admitting: Emergency Medicine

## 2017-07-26 DIAGNOSIS — R070 Pain in throat: Secondary | ICD-10-CM | POA: Insufficient documentation

## 2017-07-26 DIAGNOSIS — J069 Acute upper respiratory infection, unspecified: Secondary | ICD-10-CM

## 2017-07-26 DIAGNOSIS — R067 Sneezing: Secondary | ICD-10-CM | POA: Diagnosis not present

## 2017-07-26 DIAGNOSIS — H9190 Unspecified hearing loss, unspecified ear: Secondary | ICD-10-CM | POA: Diagnosis not present

## 2017-07-26 DIAGNOSIS — R05 Cough: Secondary | ICD-10-CM | POA: Insufficient documentation

## 2017-07-26 DIAGNOSIS — H9201 Otalgia, right ear: Secondary | ICD-10-CM | POA: Diagnosis not present

## 2017-07-26 DIAGNOSIS — H66001 Acute suppurative otitis media without spontaneous rupture of ear drum, right ear: Secondary | ICD-10-CM

## 2017-07-26 DIAGNOSIS — B9789 Other viral agents as the cause of diseases classified elsewhere: Secondary | ICD-10-CM | POA: Diagnosis not present

## 2017-07-26 DIAGNOSIS — I10 Essential (primary) hypertension: Secondary | ICD-10-CM | POA: Insufficient documentation

## 2017-07-26 DIAGNOSIS — F17200 Nicotine dependence, unspecified, uncomplicated: Secondary | ICD-10-CM | POA: Insufficient documentation

## 2017-07-26 DIAGNOSIS — R0981 Nasal congestion: Secondary | ICD-10-CM | POA: Diagnosis present

## 2017-07-26 MED ORDER — AZITHROMYCIN 250 MG PO TABS: ORAL_TABLET | ORAL | 0 refills | Status: DC

## 2017-07-26 NOTE — ED Provider Notes (Signed)
[facility] EMERGENCY DEPARTMENT Provider Note   CSN: [id number] Arrival date & time: 07/26/17  4098     History   Chief Complaint Chief Complaint  Patient presents with  . URI    HPI TELA KOTECKI is a 37 y.o. female.  Patient is a 37 year old female presenting for evaluation of sinus congestion, cough, right earache, decreased hearing, and sore throat for the past 3 days.   The history is provided by the patient.  URI   This is a new problem. Episode onset: 3 days ago. The problem has been gradually worsening. There has been no fever. Associated symptoms include ear pain, sneezing, sore throat and cough. Pertinent negatives include no diarrhea, no nausea and no vomiting. She has tried nothing for the symptoms.    Past Medical History:  Diagnosis Date  . Hypertension   . MRSA (methicillin resistant Staphylococcus aureus)     Patient Active Problem List   Diagnosis Date Noted  . Plantar fasciitis of left foot 01/20/2016    Past Surgical History:  Procedure Laterality Date  . TONSILLECTOMY      OB History    No data available       Home Medications    Prior to Admission medications   Not on File    Family History No family history on file.  Social History Social History   Tobacco Use  . Smoking status: Current Every Day Smoker  . Smokeless tobacco: Never Used  Substance Use Topics  . Alcohol use: Yes    Comment: occasional   . Drug use: No     Allergies   Mobic [meloxicam]   Review of Systems Review of Systems  HENT: Positive for ear pain, sneezing and sore throat.   Respiratory: Positive for cough.   Gastrointestinal: Negative for diarrhea, nausea and vomiting.  All other systems reviewed and are negative.    Physical Exam Updated Vital Signs BP (!) 143/85 (BP Location: Left Arm)   Pulse 78   Temp 98.7 F (37.1 C) (Oral)   Resp 18   Ht 5\' 8"  (1.727 m)   Wt 119.3 kg (263 lb)   SpO2 96%   BMI 39.99 kg/m    Physical Exam  Constitutional: She is oriented to person, place, and time. She appears well-developed and well-nourished. No distress.  HENT:  Head: Normocephalic and atraumatic.  Mouth/Throat: Oropharynx is clear and moist.  There is inflammation of the right TM with fluid in the middle ear.  Neck: Normal range of motion. Neck supple.  Cardiovascular: Normal rate and regular rhythm. Exam reveals no gallop and no friction rub.  No murmur heard. Pulmonary/Chest: Effort normal and breath sounds normal. No respiratory distress. She has no wheezes.  Abdominal: Soft. Bowel sounds are normal. She exhibits no distension. There is no tenderness.  Musculoskeletal: Normal range of motion.  Neurological: She is alert and oriented to person, place, and time.  Skin: Skin is warm and dry. She is not diaphoretic.  Nursing note and vitals reviewed.    ED Treatments / Results  Labs (all labs ordered are listed, but only abnormal results are displayed) Labs Reviewed - No data to display  EKG  EKG Interpretation None       Radiology No results found.  Procedures Procedures (including critical care time)  Medications Ordered in ED Medications - No data to display   Initial Impression / Assessment and Plan / ED Course  I have reviewed the triage vital signs and  the nursing notes.  Pertinent labs & imaging results that were available during my care of the patient were reviewed by me and considered in my medical decision making (see chart for details).  Patient's symptoms likely viral, however she does appear to have a right otitis media.  She will be given Zithromax and is to follow-up as needed.  Final Clinical Impressions(s) / ED Diagnoses   Final diagnoses:  None    ED Discharge Orders    None       Geoffery Lyonselo, Damany Eastman, MD 07/26/17 [phone]

## 2017-07-26 NOTE — ED Triage Notes (Signed)
Pt c/o cough, sore throat, sinus pain and drainage since sun.

## 2017-07-26 NOTE — Discharge Instructions (Signed)
Zithromax as prescribed.  Over-the-counter decongestant as needed for congestion/ear pressure.  Follow-up with your primary doctor if not improving in the next 3-4 days.

## 2018-06-20 ENCOUNTER — Ambulatory Visit (INDEPENDENT_AMBULATORY_CARE_PROVIDER_SITE_OTHER): Payer: BLUE CROSS/BLUE SHIELD

## 2018-06-20 ENCOUNTER — Ambulatory Visit (INDEPENDENT_AMBULATORY_CARE_PROVIDER_SITE_OTHER): Payer: BLUE CROSS/BLUE SHIELD | Admitting: Podiatry

## 2018-06-20 ENCOUNTER — Encounter: Payer: Self-pay | Admitting: Podiatry

## 2018-06-20 DIAGNOSIS — M722 Plantar fascial fibromatosis: Secondary | ICD-10-CM

## 2018-06-20 DIAGNOSIS — M779 Enthesopathy, unspecified: Secondary | ICD-10-CM

## 2018-06-21 ENCOUNTER — Telehealth: Payer: Self-pay | Admitting: *Deleted

## 2018-06-21 DIAGNOSIS — M722 Plantar fascial fibromatosis: Secondary | ICD-10-CM

## 2018-06-21 DIAGNOSIS — M779 Enthesopathy, unspecified: Secondary | ICD-10-CM

## 2018-06-21 NOTE — Telephone Encounter (Signed)
-----   Message from Vivi Barrack, DPM sent at 06/20/2018  3:41 PM EST ----- Can you please order an MRI of the left ankle to rule out plantar fascial tear? Thanks.

## 2018-06-21 NOTE — Telephone Encounter (Signed)
Orders to J. Quintana, RN for pre-cert, faxed to [facility]. 

## 2018-06-22 ENCOUNTER — Telehealth: Payer: Self-pay | Admitting: *Deleted

## 2018-06-22 NOTE — Progress Notes (Signed)
Subjective: 38 year old female presents the office today for concerns of pain to both of her feet.  States the left foot is been getting worse than the right and also certainly cause pain to her ankle.  She has changed shoes, inserts, stretching, icing, anti-inflammatories and significant improvement.  This time she wants to consider surgery or other treatment options.  She does continue to stand all day at work which aggravates her symptoms.  She has no other concerns Denies any systemic complaints such as fevers, chills, nausea, vomiting. No acute changes since last appointment, and no other complaints at this time.   Objective: AAO x3, NAD DP/PT pulses palpable bilaterally, CRT less than 3 seconds Negative tinel sign There is tenderness palpation of the plantar medial tubercle of the calcaneus at the insertion of the plantar fascia bilaterally left side worse than right.  There is no pain to the right ankle however on the left side there is some discomfort on the course the peroneal tendon.  Peroneal tendon appears to be intact.  There is edema to the ankle as well along the tendon.  Achilles tendon, flexor, extensor tendons appear to be intact. No open lesions or pre-ulcerative lesions.  No pain with calf compression, swelling, warmth, erythema  Assessment: Chronic bilateral heel pain left side worse than right  Plan: -All treatment options discussed with the patient including all alternatives, risks, complications.  -Repeat x-rays were obtained and reviewed which did not reveal any evidence of acute fracture or stress fracture. -At this time given her prolonged nature of symptoms as well despite conservative treatment and still having discomfort we discussed other options including physical therapy, EPAT, surgery.  She like to proceed with surgery.  Discussed this is not a guarantee resolution of symptoms.  Prior to surgery may order MRI for preoperative evaluation.  This was ordered  today. -Patient encouraged to call the office with any questions, concerns, change in symptoms.   Vivi Barrack DPM

## 2018-06-22 NOTE — Telephone Encounter (Signed)
[facility] states she called [employer] for pre-cert information and they are requesting clinicals. I reviewed Chart Review and 06/20/2018 are not available. I told Alvino Chapel I would have Dr. Ardelle Anton dictate the clinicals and they would be faxed to [facility]. Alvino Chapel states [employer] fax [phone], Reference# [id number].

## 2018-06-22 NOTE — Telephone Encounter (Signed)
Note is done.  

## 2018-06-22 NOTE — Telephone Encounter (Signed)
Faxed clinical to [employer] with copy of orders.

## 2018-06-24 ENCOUNTER — Ambulatory Visit
Admission: RE | Admit: 2018-06-24 | Discharge: 2018-06-24 | Disposition: A | Discharge status: Home / Self Care | Payer: BLUE CROSS/BLUE SHIELD | Source: Ambulatory Visit | Attending: Podiatry | Admitting: Podiatry

## 2018-06-27 ENCOUNTER — Encounter: Payer: Self-pay | Admitting: Podiatry

## 2018-06-27 NOTE — Progress Notes (Signed)
I called the patient to go over the MRI results.  After discussion she has had chronic pain over the last couple years to her heel.  Although the MRI did show that the plantar fascia is intact clinically she still having pain on the plantar fascia.  We discussed previously endoscopic plantar fascial release and she wants to proceed with this.  We went over the postoperative course and onset I saw her we did talk about the surgery.  She was to proceed with surgery as soon as possible.  I submitted paperwork for surgery to [facility] and she will call the patient to schedule and I will see her beforehand for consent.  She had no further questions or concerns per encouraged to call if any changes were to recur.  Vivi Barrack

## 2018-12-19 ENCOUNTER — Emergency Department (HOSPITAL_BASED_OUTPATIENT_CLINIC_OR_DEPARTMENT_OTHER)
Admission: EM | Admit: 2018-12-19 | Discharge: 2018-12-19 | Disposition: A | Discharge status: Home / Self Care | Payer: BC Managed Care – PPO | Attending: Emergency Medicine | Admitting: Emergency Medicine

## 2018-12-19 ENCOUNTER — Emergency Department (HOSPITAL_BASED_OUTPATIENT_CLINIC_OR_DEPARTMENT_OTHER): Payer: BC Managed Care – PPO

## 2018-12-19 ENCOUNTER — Encounter (HOSPITAL_BASED_OUTPATIENT_CLINIC_OR_DEPARTMENT_OTHER): Payer: Self-pay

## 2018-12-19 ENCOUNTER — Other Ambulatory Visit: Payer: Self-pay

## 2018-12-19 DIAGNOSIS — Z87891 Personal history of nicotine dependence: Secondary | ICD-10-CM | POA: Insufficient documentation

## 2018-12-19 DIAGNOSIS — I10 Essential (primary) hypertension: Secondary | ICD-10-CM | POA: Diagnosis not present

## 2018-12-19 DIAGNOSIS — M25551 Pain in right hip: Secondary | ICD-10-CM

## 2018-12-19 HISTORY — DX: Unspecified osteoarthritis, unspecified site: M19.90

## 2018-12-19 LAB — PREGNANCY, URINE: Preg Test, Ur: NEGATIVE

## 2018-12-19 MED ORDER — CYCLOBENZAPRINE HCL 10 MG PO TABS: 10.0000 mg | ORAL_TABLET | Freq: Two times a day (BID) | ORAL | 0 refills | Status: DC | PRN

## 2018-12-19 NOTE — ED Provider Notes (Signed)
[facility] EMERGENCY DEPARTMENT Provider Note   CSN: [id number] Arrival date & time: 12/19/18  1515    History   Chief Complaint Chief Complaint  Patient presents with  . Hip Pain    HPI Adrienne Roberts is a 38 y.o. female.     The history is provided by the patient.  Hip Pain This is a new problem. The current episode started 6 to 12 hours ago. The problem occurs constantly. The problem has not changed since onset.Associated symptoms include headaches. Pertinent negatives include no chest pain, no abdominal pain and no shortness of breath. The symptoms are aggravated by walking. Nothing relieves the symptoms. She has tried nothing for the symptoms. The treatment provided no relief.    Past Medical History:  Diagnosis Date  . Arthritis   . Hypertension   . MRSA (methicillin resistant Staphylococcus aureus)     Patient Active Problem List   Diagnosis Date Noted  . Plantar fasciitis of left foot 01/20/2016    Past Surgical History:  Procedure Laterality Date  . TONSILLECTOMY       OB History   No obstetric history on file.      Home Medications    Prior to Admission medications   Medication Sig Start Date End Date Taking? Authorizing Provider  cyclobenzaprine (FLEXERIL) 10 MG tablet Take 1 tablet (10 mg total) by mouth 2 (two) times daily as needed for up to 15 doses for muscle spasms. 12/19/18   Lennice Sites, DO    Family History No family history on file.  Social History Social History   Tobacco Use  . Smoking status: Former Research scientist (life sciences)  . Smokeless tobacco: Never Used  Substance Use Topics  . Alcohol use: Yes    Comment: occasional   . Drug use: No     Allergies   Mobic [meloxicam]   Review of Systems Review of Systems  Constitutional: Negative for chills and fever.  HENT: Negative for ear pain and sore throat.   Eyes: Negative for pain and visual disturbance.  Respiratory: Negative for cough and shortness of breath.    Cardiovascular: Negative for chest pain and palpitations.  Gastrointestinal: Negative for abdominal pain and vomiting.  Genitourinary: Negative for dysuria and hematuria.  Musculoskeletal: Positive for arthralgias and gait problem. Negative for back pain.  Skin: Negative for color change and rash.  Neurological: Positive for headaches. Negative for seizures and syncope.  All other systems reviewed and are negative.    Physical Exam Updated Vital Signs BP 134/68 (BP Location: Right Arm)   Pulse 78   Temp 98 F (36.7 C)   Resp 18   Ht 5\' 8"  (1.727 m)   Wt 115.7 kg   LMP 11/07/2018   SpO2 99%   BMI 38.77 kg/m   Physical Exam Vitals signs and nursing note reviewed.  Constitutional:      General: She is not in acute distress.    Appearance: She is well-developed.  HENT:     Head: Normocephalic and atraumatic.     Mouth/Throat:     Mouth: Mucous membranes are moist.  Eyes:     Extraocular Movements: Extraocular movements intact.     Conjunctiva/sclera: Conjunctivae normal.     Pupils: Pupils are equal, round, and reactive to light.  Neck:     Musculoskeletal: Neck supple.  Cardiovascular:     Rate and Rhythm: Normal rate and regular rhythm.     Heart sounds: No murmur.  Pulmonary:  Effort: Pulmonary effort is normal. No respiratory distress.     Breath sounds: Normal breath sounds.  Abdominal:     Palpations: Abdomen is soft.     Tenderness: There is no abdominal tenderness.  Musculoskeletal: Normal range of motion.        General: Tenderness (TTP in right hip, right IT band) present.     Right lower leg: No edema.     Left lower leg: No edema.  Skin:    General: Skin is warm and dry.  Neurological:     General: No focal deficit present.     Mental Status: She is alert.     Cranial Nerves: No cranial nerve deficit.     Sensory: No sensory deficit.     Motor: No weakness.     Comments: 5+/5 strength, normal sensation      ED Treatments / Results  Labs  (all labs ordered are listed, but only abnormal results are displayed) Labs Reviewed  PREGNANCY, URINE    EKG None  Radiology Dg Hip Unilat With Pelvis 2-3 Views Right  Result Date: 12/19/2018 CLINICAL DATA:  Sudden onset of right-sided hip pain, no known injury, initial encounter EXAM: DG HIP (WITH OR WITHOUT PELVIS) 2-3V RIGHT COMPARISON:  None. FINDINGS: Pelvic ring is intact. No acute fracture or dislocation is noted. No soft tissue abnormality is seen. IMPRESSION: No acute abnormality noted. Electronically Signed   By: Alcide CleverMark  Lukens M.D.   On: 12/19/2018 18:03    Procedures Procedures (including critical care time)  Medications Ordered in ED Medications - No data to display   Initial Impression / Assessment and Plan / ED Course  I have reviewed the triage vital signs and the nursing notes.  Pertinent labs & imaging results that were available during my care of the patient were reviewed by me and considered in my medical decision making (see chart for details).        Adrienne Roberts is a 38 year old female with no significant medical history who presents to the ED with right hip pain.  Patient tender in the right hip.  Has had new workouts recently.  Tender in the IT band region.  X-rays of the right hip are unremarkable.  Patient with normal neurological exam.  Overall believe patient with MSK pain.  Possibly IT band issue.  Given home stretches.  Recommend Tylenol, Motrin.  Given prescription for Flexeril.  Recommend follow-up with primary care doctor.  Discharged in good condition.  This chart was dictated using voice recognition software.  Despite best efforts to proofread,  errors can occur which can change the documentation meaning.   Final Clinical Impressions(s) / ED Diagnoses   Final diagnoses:  Right hip pain    ED Discharge Orders         Ordered    cyclobenzaprine (FLEXERIL) 10 MG tablet  2 times daily PRN     12/19/18 1736           Virgina NorfolkCuratolo, Peony Barner, DO  12/19/18 2039

## 2018-12-19 NOTE — ED Triage Notes (Signed)
Pt states she woke with right hip pain today-she sat in her car foe her lunch and had to call for help to get out of car due to pain-NAD-to triage in w/c

## 2020-06-05 ENCOUNTER — Emergency Department (HOSPITAL_BASED_OUTPATIENT_CLINIC_OR_DEPARTMENT_OTHER)
Admission: EM | Admit: 2020-06-05 | Discharge: 2020-06-05 | Disposition: A | Discharge status: Home / Self Care | Payer: BC Managed Care – PPO | Attending: Emergency Medicine | Admitting: Emergency Medicine

## 2020-06-05 ENCOUNTER — Emergency Department (HOSPITAL_BASED_OUTPATIENT_CLINIC_OR_DEPARTMENT_OTHER): Payer: BC Managed Care – PPO

## 2020-06-05 ENCOUNTER — Encounter (HOSPITAL_BASED_OUTPATIENT_CLINIC_OR_DEPARTMENT_OTHER): Payer: Self-pay | Admitting: Emergency Medicine

## 2020-06-05 ENCOUNTER — Other Ambulatory Visit: Payer: Self-pay

## 2020-06-05 DIAGNOSIS — Z79899 Other long term (current) drug therapy: Secondary | ICD-10-CM | POA: Insufficient documentation

## 2020-06-05 DIAGNOSIS — Z87891 Personal history of nicotine dependence: Secondary | ICD-10-CM | POA: Diagnosis not present

## 2020-06-05 DIAGNOSIS — R079 Chest pain, unspecified: Secondary | ICD-10-CM | POA: Diagnosis present

## 2020-06-05 DIAGNOSIS — K219 Gastro-esophageal reflux disease without esophagitis: Secondary | ICD-10-CM | POA: Insufficient documentation

## 2020-06-05 DIAGNOSIS — R0789 Other chest pain: Secondary | ICD-10-CM | POA: Insufficient documentation

## 2020-06-05 DIAGNOSIS — I10 Essential (primary) hypertension: Secondary | ICD-10-CM | POA: Diagnosis not present

## 2020-06-05 LAB — CBC
HCT: 39.6 % (ref 36.0–46.0)
Hemoglobin: 13.9 g/dL (ref 12.0–15.0)
MCH: 26.5 pg (ref 26.0–34.0)
MCHC: 35.1 g/dL (ref 30.0–36.0)
MCV: 75.4 fL — ABNORMAL LOW (ref 80.0–100.0)
Platelets: 351 10*3/uL (ref 150–400)
RBC: 5.25 MIL/uL — ABNORMAL HIGH (ref 3.87–5.11)
RDW: 14.7 % (ref 11.5–15.5)
WBC: 8.6 10*3/uL (ref 4.0–10.5)
nRBC: 0 % (ref 0.0–0.2)

## 2020-06-05 LAB — BASIC METABOLIC PANEL
Anion gap: 9 (ref 5–15)
BUN: 15 mg/dL (ref 6–20)
CO2: 26 mmol/L (ref 22–32)
Calcium: 8.9 mg/dL (ref 8.9–10.3)
Chloride: 102 mmol/L (ref 98–111)
Creatinine, Ser: 0.9 mg/dL (ref 0.44–1.00)
GFR, Estimated: >60 mL/min (ref >60)
Glucose, Bld: 208 mg/dL — ABNORMAL HIGH (ref 70–99)
Potassium: 3.9 mmol/L (ref 3.5–5.1)
Sodium: 137 mmol/L (ref 135–145)

## 2020-06-05 LAB — TROPONIN I (HIGH SENSITIVITY)
Troponin I (High Sensitivity): 3 ng/L (ref <18)
Troponin I (High Sensitivity): 2 ng/L (ref <18)

## 2020-06-05 LAB — PREGNANCY, URINE: Preg Test, Ur: NEGATIVE

## 2020-06-05 MED ORDER — LIDOCAINE VISCOUS HCL 2 % MT SOLN
15.0000 mL | Freq: Once | OROMUCOSAL | Status: AC
  Administered 2020-06-05: 15 mL via ORAL
  Filled 2020-06-05: qty 15

## 2020-06-05 MED ORDER — OMEPRAZOLE 20 MG PO CPDR: 20.0000 mg | DELAYED_RELEASE_CAPSULE | Freq: Every day | ORAL | 0 refills | Status: AC

## 2020-06-05 MED ORDER — ALUM & MAG HYDROXIDE-SIMETH 200-200-20 MG/5ML PO SUSP
30.0000 mL | Freq: Once | ORAL | Status: AC
  Administered 2020-06-05: 30 mL via ORAL
  Filled 2020-06-05: qty 30

## 2020-06-05 NOTE — ED Provider Notes (Addendum)
[facility] EMERGENCY DEPARTMENT Provider Note   CSN: [id number] Arrival date & time: 06/05/20  2595     History Chief Complaint  Patient presents with  . Chest Pain    Adrienne Roberts is a 39 y.o. female who presents with concern for 2 days of intermittent central and left-sided chest pain.  States pain is heavy, dull in the center of her chest.  Additionally endorses belching over the last few days.  She states that she has been constipated for the last week, had large bowel movement yesterday after 2 doses of laxatives.  States that she feels that her food is "just sitting there" while she points to where her stomach would be.  Endorses mild radiation of her pain into her left shoulder, however states that she has been doing a lot more physical labor at work the past week and feels like she may have injured her muscles in that arm as she has been lifting heavy boxes all week.  She denies palpitations or shortness of breath.  Endorses episode of atypical chest pain in the past, however is unable to explain any further of that episode.  Denies congestion, sore throat, nausea, vomiting, diarrhea, abdominal pain, headache, lightheadedness, dizziness, loss of taste or smell.  Personally reviewed this patient's medical records history of hypertension and arthritis.  Patient is not on any medication every day for hypertension as she never followed up to get a prescription.  Denies history of blood clot or malignancy, denies recent surgical procedures or prolonged immobilization or travel.  Denies hormone replacement therapy.  HPI     Past Medical History:  Diagnosis Date  . Arthritis   . Hypertension   . MRSA (methicillin resistant Staphylococcus aureus)     Patient Active Problem List   Diagnosis Date Noted  . Plantar fasciitis of left foot 01/20/2016    Past Surgical History:  Procedure Laterality Date  . TONSILLECTOMY       OB History   No obstetric history on  file.     No family history on file.  Social History   Tobacco Use  . Smoking status: Former Games developer  . Smokeless tobacco: Never Used  Vaping Use  . Vaping Use: Never used  Substance Use Topics  . Alcohol use: Yes    Comment: occasional   . Drug use: No    Home Medications Prior to Admission medications   Medication Sig Start Date End Date Taking? Authorizing Provider  cyclobenzaprine (FLEXERIL) 10 MG tablet Take 1 tablet (10 mg total) by mouth 2 (two) times daily as needed for up to 15 doses for muscle spasms. 12/19/18   Curatolo, Adam, DO  omeprazole (PRILOSEC) 20 MG capsule Take 1 capsule (20 mg total) by mouth daily. 06/05/20 07/05/20  Brock Larmon, Eugene Gavia, PA-C    Allergies    Mobic [meloxicam]  Review of Systems   Review of Systems  Constitutional: Negative for activity change, appetite change, chills, diaphoresis, fatigue and fever.  Eyes: Negative.   Respiratory: Negative for cough, chest tightness, shortness of breath and wheezing.   Cardiovascular: Positive for chest pain. Negative for palpitations and leg swelling.  Gastrointestinal: Negative for abdominal pain, nausea and vomiting.  Genitourinary: Negative for dysuria, flank pain, hematuria and urgency.  Musculoskeletal: Negative.   Skin: Negative.   Neurological: Negative.  Negative for light-headedness.  Psychiatric/Behavioral: Negative.     Physical Exam Updated Vital Signs BP 133/69 (BP Location: Left Arm)   Pulse 64   Temp  98.1 F (36.7 C) (Oral)   Resp 14   Ht 5\' 7"  (1.702 m)   Wt 122 kg   LMP 04/21/2020   SpO2 98%   BMI 42.13 kg/m   Physical Exam Vitals and nursing note reviewed.  Constitutional:      Appearance: Normal appearance. She is not ill-appearing.  HENT:     Head: Normocephalic and atraumatic.     Right Ear: External ear normal.     Left Ear: External ear normal.     Nose: Nose normal.     Mouth/Throat:     Mouth: Mucous membranes are moist.     Pharynx: Oropharynx is  clear. Uvula midline. No pharyngeal swelling, oropharyngeal exudate or posterior oropharyngeal erythema.     Tonsils: No tonsillar exudate.  Eyes:     General: Vision grossly intact.        Right eye: No discharge.        Left eye: No discharge.     Extraocular Movements: Extraocular movements intact.     Conjunctiva/sclera: Conjunctivae normal.     Pupils: Pupils are equal, round, and reactive to light.  Neck:     Trachea: Trachea and phonation normal.  Cardiovascular:     Rate and Rhythm: Normal rate and regular rhythm.     Pulses: Normal pulses.          Radial pulses are 2+ on the right side and 2+ on the left side.       Dorsalis pedis pulses are 2+ on the right side and 2+ on the left side.     Heart sounds: Normal heart sounds. No murmur heard.   Pulmonary:     Effort: Pulmonary effort is normal. No prolonged expiration or respiratory distress.     Breath sounds: Normal breath sounds. No wheezing or rales.  Chest:     Chest wall: No mass, lacerations, deformity, swelling, tenderness, crepitus or edema.  Abdominal:     General: Bowel sounds are normal. There is no distension.     Tenderness: There is no abdominal tenderness.  Musculoskeletal:        General: No deformity.     Right shoulder: Normal.     Left shoulder: Tenderness present. No bony tenderness.     Right upper arm: Normal.     Left upper arm: Normal.     Right elbow: Normal.     Left elbow: Normal.     Right forearm: Normal.     Left forearm: Normal.     Right wrist: Normal.     Left wrist: Normal.     Right hand: Normal.     Left hand: Normal.       Arms:     Cervical back: Normal range of motion and neck supple. No rigidity or crepitus. No pain with movement, spinous process tenderness or muscular tenderness.     Right lower leg: No tenderness. No edema.     Left lower leg: No tenderness. No edema.  Lymphadenopathy:     Cervical: No cervical adenopathy.  Skin:    General: Skin is warm and dry.      Capillary Refill: Capillary refill takes less than 2 seconds.  Neurological:     General: No focal deficit present.     Mental Status: She is alert and oriented to person, place, and time. Mental status is at baseline.  Psychiatric:        Mood and Affect: Mood normal.     ED  Results / Procedures / Treatments   Labs (all labs ordered are listed, but only abnormal results are displayed) Labs Reviewed  BASIC METABOLIC PANEL - Abnormal; Notable for the following components:      Result Value   Glucose, Bld 208 (*)    All other components within normal limits  CBC - Abnormal; Notable for the following components:   RBC 5.25 (*)    MCV 75.4 (*)    All other components within normal limits  PREGNANCY, URINE  TROPONIN I (HIGH SENSITIVITY)  TROPONIN I (HIGH SENSITIVITY)    EKG EKG Interpretation  Date/Time:  Thursday June 05 2020 10:50:22 EST Ventricular Rate:  64 PR Interval:    QRS Duration: 82 QT Interval:  391 QTC Calculation: 404 R Axis:   68 Text Interpretation: Sinus rhythm When compared to prior, slower rate. No STEMI Confirmed by Theda Belfast ([id number]) on 06/05/2020 11:38:42 AM   Radiology DG Chest 2 View  Result Date: 06/05/2020 CLINICAL DATA:  Left-sided chest pain EXAM: CHEST - 2 VIEW COMPARISON:  04/01/2010 FINDINGS: The heart size and mediastinal contours are within normal limits. Both lungs are clear. The visualized skeletal structures are unremarkable. IMPRESSION: No active cardiopulmonary disease. Electronically Signed   By: Alcide Clever M.D.   On: 06/05/2020 12:23    Procedures Procedures (including critical care time)  Medications Ordered in ED Medications  alum & mag hydroxide-simeth (MAALOX/MYLANTA) 200-200-20 MG/5ML suspension 30 mL (30 mLs Oral Given 06/05/20 1223)    And  lidocaine (XYLOCAINE) 2 % viscous mouth solution 15 mL (15 mLs Oral Given 06/05/20 1222)    ED Course  I have reviewed the triage vital signs and the nursing  notes.  Pertinent labs & imaging results that were available during my care of the patient were reviewed by me and considered in my medical decision making (see chart for details).    MDM Rules/Calculators/A&P                         39 year old female presents with concern for 2 days of central chest pain and heaviness.  Differential diagnosis for the patient is included related to ACS, PE, pneumonia, pleuritis, reactive airway disease, GERD, musculoskeletal pain.   Hypertensive on intake to 160/84.    Physical exam is very reassuring, cardiopulmonary exam is normal.  Abdominal exam is benign.  Muscular spasm of the left trapezius and upper arm, suspect acute strain as cause for patient's left shoulder pain.  Analgesia offered in emergency department, patient declined.  Laboratory studies obtained in triage as follows.  CBC unremarkable, BMP significant for hyperglycemia 208.  Urine pregnancy test negative.  Troponin negative, 2.  Chest x-ray negative for acute cardiopulmonary disease.  EKG sinus rhythm, no STEMI.  PERC negative. HEART score of 2.   Work-up performed in triage is very reassuring.  No longer suspect cardiac etiology for this patient's symptoms.  Given recent history of belching and sensation of early satiety suspect possible GERD as etiology for this patient symptoms.  Will proceed with GI cocktail.  Patient reevaluated after administration of GI cocktail; endorses complete resolution of her chest pain.  Given reassuring physical exam, vital signs, laboratory studies, EKG, and chest x-ray, no further work-up is warranted in the emergency department at this time.  Suspect GERD as cause for patient's chest pain; will discharge home with course of PPI.    Regarding her arm pain, suspect acute muscular strain; muscle relaxer offered, patient declined.  Recommend Tylenol/ ibuprofen at home as needed as well as massage after heat application.  Marcelle Smilingatasha voiced understanding  of her medical evaluation and treatment plan.  Her questions were answered to her expressed satisfaction.  Return precautions were given.  We will have patient follow-up with cardiology as well.  Patient is well-appearing, stable, and appropriate for discharge at this time.  Final Clinical Impression(s) / ED Diagnoses Final diagnoses:  Atypical chest pain  Gastroesophageal reflux disease, unspecified whether esophagitis present    Rx / DC Orders ED Discharge Orders         Ordered    omeprazole (PRILOSEC) 20 MG capsule  Daily        06/05/20 1440           Paris LoreSponseller, Ashlin Kreps R, PA-C 06/05/20 1646    Philicia Heyne, Eugene GaviaRebekah R, PA-C 06/05/20 1748    Tegeler, Canary Brimhristopher J, MD 06/06/20 [phone]

## 2020-06-05 NOTE — ED Triage Notes (Signed)
Intermittent L side chest pain radiating into her L arm x 2 days.

## 2020-06-05 NOTE — Discharge Instructions (Signed)
You were evaluated in the emergency department today for your chest pain and your left arm pain. Your physical exam, vital signs, blood work, EKG, and chest x-ray were very reassuring.  There does not appear to be any emergent issue with your heart or lungs at this time. Your chest pain resolved after administration of our medication for acid reflux.  I suspect that this has been the source of your chest pain, given recent increase in your belching as well.  You have been prescribed a medication called omeprazole which can help with reflux.  He should take this daily for the next 4 weeks.  Regarding your arm pain, your muscles in your shoulder and your upper arm are in spasm, I suspect this is secondary to the increased physical labor at work in the last week.  You should refrain from this type of work for the next few days.  You may utilize Tylenol /ibuprofen as needed, I recommend you massage the area after hot showers/heating pad application.  Below is contact information for cardiologist Dr. Delton See; I recommend you schedule appointment for follow-up regarding your atypical chest pain.  Return to the emergency department if you develop any worsening chest pain, shortness of breath, nausea / vomiting does not stop, or other new severe symptoms.

## 2020-06-10 ENCOUNTER — Emergency Department (HOSPITAL_BASED_OUTPATIENT_CLINIC_OR_DEPARTMENT_OTHER): Payer: BC Managed Care – PPO

## 2020-06-10 ENCOUNTER — Encounter (HOSPITAL_BASED_OUTPATIENT_CLINIC_OR_DEPARTMENT_OTHER): Payer: Self-pay | Admitting: Emergency Medicine

## 2020-06-10 ENCOUNTER — Other Ambulatory Visit: Payer: Self-pay

## 2020-06-10 ENCOUNTER — Emergency Department (HOSPITAL_BASED_OUTPATIENT_CLINIC_OR_DEPARTMENT_OTHER)
Admission: EM | Admit: 2020-06-10 | Discharge: 2020-06-10 | Disposition: A | Discharge status: Home / Self Care | Payer: BC Managed Care – PPO | Attending: Emergency Medicine | Admitting: Emergency Medicine

## 2020-06-10 DIAGNOSIS — U071 COVID-19: Secondary | ICD-10-CM | POA: Insufficient documentation

## 2020-06-10 DIAGNOSIS — I10 Essential (primary) hypertension: Secondary | ICD-10-CM | POA: Insufficient documentation

## 2020-06-10 DIAGNOSIS — R6883 Chills (without fever): Secondary | ICD-10-CM | POA: Diagnosis present

## 2020-06-10 DIAGNOSIS — R000 Tachycardia, unspecified: Secondary | ICD-10-CM | POA: Diagnosis not present

## 2020-06-10 DIAGNOSIS — Z87891 Personal history of nicotine dependence: Secondary | ICD-10-CM | POA: Diagnosis not present

## 2020-06-10 LAB — CBC WITH DIFFERENTIAL/PLATELET
Abs Immature Granulocytes: 0.03 10*3/uL (ref 0.00–0.07)
Basophils Absolute: 0 10*3/uL (ref 0.0–0.1)
Basophils Relative: 0 %
Eosinophils Absolute: 0 10*3/uL (ref 0.0–0.5)
Eosinophils Relative: 1 %
HCT: 36.5 % (ref 36.0–46.0)
Hemoglobin: 12.9 g/dL (ref 12.0–15.0)
Immature Granulocytes: 0 %
Lymphocytes Relative: 8 %
Lymphs Abs: 0.6 10*3/uL — ABNORMAL LOW (ref 0.7–4.0)
MCH: 26.3 pg (ref 26.0–34.0)
MCHC: 35.3 g/dL (ref 30.0–36.0)
MCV: 74.3 fL — ABNORMAL LOW (ref 80.0–100.0)
Monocytes Absolute: 1.2 10*3/uL — ABNORMAL HIGH (ref 0.1–1.0)
Monocytes Relative: 17 %
Neutro Abs: 5.3 10*3/uL (ref 1.7–7.7)
Neutrophils Relative %: 74 %
Platelets: 288 10*3/uL (ref 150–400)
RBC: 4.91 MIL/uL (ref 3.87–5.11)
RDW: 14.7 % (ref 11.5–15.5)
WBC: 7.2 10*3/uL (ref 4.0–10.5)
nRBC: 0 % (ref 0.0–0.2)

## 2020-06-10 LAB — COMPREHENSIVE METABOLIC PANEL
ALT: 18 U/L (ref 0–44)
AST: 19 U/L (ref 15–41)
Albumin: 3.6 g/dL (ref 3.5–5.0)
Alkaline Phosphatase: 48 U/L (ref 38–126)
Anion gap: 9 (ref 5–15)
BUN: 10 mg/dL (ref 6–20)
CO2: 23 mmol/L (ref 22–32)
Calcium: 8.2 mg/dL — ABNORMAL LOW (ref 8.9–10.3)
Chloride: 102 mmol/L (ref 98–111)
Creatinine, Ser: 0.75 mg/dL (ref 0.44–1.00)
GFR, Estimated: >60 mL/min (ref >60)
Glucose, Bld: 177 mg/dL — ABNORMAL HIGH (ref 70–99)
Potassium: 3.5 mmol/L (ref 3.5–5.1)
Sodium: 134 mmol/L — ABNORMAL LOW (ref 135–145)
Total Bilirubin: 0.5 mg/dL (ref 0.3–1.2)
Total Protein: 6.6 g/dL (ref 6.5–8.1)

## 2020-06-10 LAB — LACTIC ACID, PLASMA: Lactic Acid, Venous: 1.3 mmol/L (ref 0.5–1.9)

## 2020-06-10 LAB — RESP PANEL BY RT-PCR (FLU A&B, COVID) ARPGX2
Influenza A by PCR: NEGATIVE
Influenza B by PCR: NEGATIVE
SARS Coronavirus 2 by RT PCR: POSITIVE — AB

## 2020-06-10 LAB — CBG MONITORING, ED: Glucose-Capillary: 187 mg/dL — ABNORMAL HIGH (ref 70–99)

## 2020-06-10 MED ORDER — ACETAMINOPHEN 500 MG PO TABS
1000.0000 mg | ORAL_TABLET | Freq: Once | ORAL | Status: AC
  Administered 2020-06-10: 1000 mg via ORAL
  Filled 2020-06-10: qty 2

## 2020-06-10 MED ORDER — DEXAMETHASONE SODIUM PHOSPHATE 10 MG/ML IJ SOLN
8.0000 mg | Freq: Once | INTRAMUSCULAR | Status: AC
  Administered 2020-06-10: 10:00:00 8 mg via INTRAVENOUS
  Filled 2020-06-10: qty 1

## 2020-06-10 MED ORDER — SODIUM CHLORIDE 0.9 % IV BOLUS
1000.0000 mL | Freq: Once | INTRAVENOUS | Status: AC
  Administered 2020-06-10: 1000 mL via INTRAVENOUS

## 2020-06-10 NOTE — ED Notes (Signed)
Reports headache for "days"

## 2020-06-10 NOTE — ED Triage Notes (Signed)
Pt c/o headache, body aches, chills, hot flashes.

## 2020-06-10 NOTE — ED Provider Notes (Signed)
Patient care assumed from overnight physician.  Patient is a 39 year old female who presented to the emergency department with generalized complaints.  She is Covid positive.  Chest x-ray shows basilar atelectasis.  Fever responded to Tylenol, tachycardia has resolved with IV hydration.  Blood work is otherwise reassuring, lactic is negative.  On my evaluation patient is sitting up, on room air, normal oxygenation, conversational and comfortable.  Plan for outpatient treatment.  Discussed with the patient Covid infection.  Educated the patient on Covid precautions, treatments and expectations.  Patient is stable for discharge and treatment as an outpatient. Patient agrees with the discharge plan/strict return precautions and verbalizes understanding.   Rozelle Logan, DO 06/10/20 1007

## 2020-06-10 NOTE — ED Notes (Addendum)
PT need to use bathroom, bedside commode was placed in room.

## 2020-06-10 NOTE — Discharge Instructions (Addendum)
You are Covid positive.  You are to isolate and quarantine yourself.  Please refer to the information attached with this packet.  Treat yourself symptomatically with over the counter medication as you would for a viral illness.  If you have any worsening or severe symptoms, difficulty breathing, concern for your health please return to the emergency department.

## 2020-06-10 NOTE — ED Provider Notes (Signed)
[facility] EMERGENCY DEPARTMENT Provider Note   CSN: [id number] Arrival date & time: 06/10/20  0541     History Chief Complaint  Patient presents with   Headache    Adrienne Roberts is a 39 y.o. female.  HPI Patient presents with headache body aches chills and hot flashes.  Has had for last few days.  Had been seen in the ER around 5 days ago for left-sided chest pain.  States that it was GERD.  After that developed the symptoms.  She is unvaccinated.  No known Covid contact.  States the headache is dull and throbbing.  States she feels very fatigued.  No numbness or weakness.  Has had cough with some white sputum production that is began over the last few days also.    Past Medical History:  Diagnosis Date   Arthritis    Hypertension    MRSA (methicillin resistant Staphylococcus aureus)     Patient Active Problem List   Diagnosis Date Noted   Plantar fasciitis of left foot 01/20/2016    Past Surgical History:  Procedure Laterality Date   TONSILLECTOMY       OB History   No obstetric history on file.     No family history on file.  Social History   Tobacco Use   Smoking status: Former Smoker   Smokeless tobacco: Never Used  Building services engineer Use: Never used  Substance Use Topics   Alcohol use: Yes    Comment: occasional    Drug use: No    Home Medications Prior to Admission medications   Medication Sig Start Date End Date Taking? Authorizing Provider  cyclobenzaprine (FLEXERIL) 10 MG tablet Take 1 tablet (10 mg total) by mouth 2 (two) times daily as needed for up to 15 doses for muscle spasms. 12/19/18   Curatolo, Adam, DO  omeprazole (PRILOSEC) 20 MG capsule Take 1 capsule (20 mg total) by mouth daily. 06/05/20 07/05/20  Sponseller, Eugene Gavia, PA-C    Allergies    Mobic [meloxicam]  Review of Systems   Review of Systems  Constitutional: Positive for appetite change, chills, fatigue and fever.  HENT: Positive for congestion.    Respiratory: Positive for cough and shortness of breath.   Cardiovascular: Positive for chest pain.  Gastrointestinal: Negative for abdominal pain.  Genitourinary: Negative for dyspareunia.  Musculoskeletal: Positive for myalgias.  Skin: Negative for rash.  Neurological: Negative for seizures.  Psychiatric/Behavioral: Negative for confusion.    Physical Exam Updated Vital Signs BP (!) 164/94    Pulse (!) 114    Temp (!) 102.9 F (39.4 C) (Oral)    Resp 20    Ht 5\' 7"  (1.702 m)    Wt 122 kg    SpO2 96%    BMI 42.13 kg/m   Physical Exam Vitals and nursing note reviewed.  HENT:     Head: Atraumatic.  Eyes:     Extraocular Movements: Extraocular movements intact.  Cardiovascular:     Rate and Rhythm: Tachycardia present.  Pulmonary:     Comments: Mildly harsh breath sounds. Musculoskeletal:     Cervical back: Neck supple.  Skin:    General: Skin is warm.  Neurological:     Mental Status: She is alert.  Psychiatric:        Mood and Affect: Mood normal.     ED Results / Procedures / Treatments   Labs (all labs ordered are listed, but only abnormal results are displayed) Labs Reviewed  COMPREHENSIVE METABOLIC PANEL - Abnormal; Notable for the following components:      Result Value   Sodium 134 (*)    Glucose, Bld 177 (*)    Calcium 8.2 (*)    All other components within normal limits  CBC WITH DIFFERENTIAL/PLATELET - Abnormal; Notable for the following components:   MCV 74.3 (*)    Lymphs Abs 0.6 (*)    Monocytes Absolute 1.2 (*)    All other components within normal limits  CBG MONITORING, ED - Abnormal; Notable for the following components:   Glucose-Capillary 187 (*)    All other components within normal limits  RESP PANEL BY RT-PCR (FLU A&B, COVID) ARPGX2  LACTIC ACID, PLASMA  LACTIC ACID, PLASMA    EKG None  Radiology DG Chest Portable 1 View  Result Date: 06/10/2020 CLINICAL DATA:  Cough. EXAM: PORTABLE CHEST 1 VIEW COMPARISON:  06/05/2020.  FINDINGS: Mediastinum and hilar structures normal. Cardiomegaly. Low lung volumes. Mild right base atelectasis/infiltrate. No pleural effusion or pneumothorax. IMPRESSION: 1. Cardiomegaly. 2. Low lung volumes. Mild right base atelectasis/infiltrate. Electronically Signed   By: Maisie Fus  Register   On: 06/10/2020 06:30    Procedures Procedures (including critical care time)  Medications Ordered in ED Medications  acetaminophen (TYLENOL) tablet 1,000 mg (1,000 mg Oral Given 06/10/20 0629)  sodium chloride 0.9 % bolus 1,000 mL (1,000 mLs Intravenous New Bag/Given 06/10/20 0640)    ED Course  I have reviewed the triage vital signs and the nursing notes.  Pertinent labs & imaging results that were available during my care of the patient were reviewed by me and considered in my medical decision making (see chart for details).    MDM Rules/Calculators/A&P                          Patient strengths with fever and cough.  Unvaccinated.  Initial heart rate of 114.  Fever 102.9.  X-ray showed possible pneumonia.  Recent visit for chest pain reviewed records from that time.  With fever and cough Covid has to be high on the differential.  However with potential localizing infiltrate on x-ray rapid Covid test done.  Basic blood work also done.  Care turned over to Dr. Wilkie Aye. Final Clinical Impression(s) / ED Diagnoses Final diagnoses:  None    Rx / DC Orders ED Discharge Orders    None       Benjiman Core, MD 06/10/20 [phone]

## 2020-11-27 ENCOUNTER — Emergency Department (HOSPITAL_BASED_OUTPATIENT_CLINIC_OR_DEPARTMENT_OTHER)
Admission: EM | Admit: 2020-11-27 | Discharge: 2020-11-27 | Disposition: A | Discharge status: Home / Self Care | Payer: BC Managed Care – PPO | Attending: Emergency Medicine | Admitting: Emergency Medicine

## 2020-11-27 ENCOUNTER — Other Ambulatory Visit: Payer: Self-pay

## 2020-11-27 ENCOUNTER — Encounter (HOSPITAL_BASED_OUTPATIENT_CLINIC_OR_DEPARTMENT_OTHER): Payer: Self-pay

## 2020-11-27 DIAGNOSIS — M5431 Sciatica, right side: Secondary | ICD-10-CM | POA: Insufficient documentation

## 2020-11-27 DIAGNOSIS — I10 Essential (primary) hypertension: Secondary | ICD-10-CM | POA: Insufficient documentation

## 2020-11-27 DIAGNOSIS — E119 Type 2 diabetes mellitus without complications: Secondary | ICD-10-CM | POA: Insufficient documentation

## 2020-11-27 DIAGNOSIS — Z87891 Personal history of nicotine dependence: Secondary | ICD-10-CM | POA: Insufficient documentation

## 2020-11-27 HISTORY — DX: Type 2 diabetes mellitus without complications: E11.9

## 2020-11-27 MED ORDER — METHOCARBAMOL 500 MG PO TABS: 500.0000 mg | ORAL_TABLET | Freq: Four times a day (QID) | ORAL | 0 refills | Status: DC

## 2020-11-27 MED ORDER — DICLOFENAC SODIUM 75 MG PO TBEC: 75.0000 mg | DELAYED_RELEASE_TABLET | Freq: Two times a day (BID) | ORAL | 0 refills | Status: DC

## 2020-11-27 NOTE — ED Triage Notes (Signed)
Pt c/o lower back pain that radiates down right LE x 4 days-denies injury-NAD-steady gait

## 2020-11-27 NOTE — Discharge Instructions (Addendum)
Return if any problems.  See your Physician for recheck in 1 week if symptoms persist. °

## 2020-11-27 NOTE — ED Provider Notes (Signed)
[facility] EMERGENCY DEPARTMENT Provider Note   CSN: [id number] Arrival date & time: 11/27/20  1439     History Chief Complaint  Patient presents with   Back Pain    Adrienne Roberts is a 40 y.o. female.  The history is provided by the patient. No language interpreter was used.  Back Pain Location:  Sacro-iliac joint Quality:  Aching Radiates to:  R posterior upper leg Pain severity:  Moderate Pain is:  Same all the time Onset quality:  Gradual Timing:  Constant Progression:  Worsening Relieved by:  Nothing Worsened by:  Nothing Ineffective treatments:  None tried Pt complains of pain that goes down her right leg,  Pt reports pain worse with sitting.     Past Medical History:  Diagnosis Date   Arthritis    Diabetes mellitus without complication (HCC)    Hypertension    MRSA (methicillin resistant Staphylococcus aureus)     Patient Active Problem List   Diagnosis Date Noted   Plantar fasciitis of left foot 01/20/2016    Past Surgical History:  Procedure Laterality Date   TONSILLECTOMY       OB History   No obstetric history on file.     No family history on file.  Social History   Tobacco Use   Smoking status: Former    Pack years: 0.00   Smokeless tobacco: Never  Vaping Use   Vaping Use: Never used  Substance Use Topics   Alcohol use: Yes    Comment: occasional    Drug use: No    Home Medications Prior to Admission medications   Medication Sig Start Date End Date Taking? Authorizing Provider  diclofenac (VOLTAREN) 75 MG EC tablet Take 1 tablet (75 mg total) by mouth 2 (two) times daily. 11/27/20  Yes Cheron Schaumann K, PA-C  methocarbamol (ROBAXIN) 500 MG tablet Take 1 tablet (500 mg total) by mouth 4 (four) times daily. 11/27/20  Yes Cheron Schaumann K, PA-C  cyclobenzaprine (FLEXERIL) 10 MG tablet Take 1 tablet (10 mg total) by mouth 2 (two) times daily as needed for up to 15 doses for muscle spasms. 12/19/18   Curatolo, Adam, DO   omeprazole (PRILOSEC) 20 MG capsule Take 1 capsule (20 mg total) by mouth daily. 06/05/20 07/05/20  Sponseller, Eugene Gavia, PA-C    Allergies    Mobic [meloxicam]  Review of Systems   Review of Systems  Musculoskeletal:  Positive for back pain.  All other systems reviewed and are negative.  Physical Exam Updated Vital Signs BP 120/61   Pulse 67   Temp 98.5 F (36.9 C) (Oral)   Resp 18   Ht 5\' 8"  (1.727 m)   Wt 114.4 kg   LMP 11/08/2020   SpO2 99%   BMI 38.35 kg/m   Physical Exam Vitals and nursing note reviewed.  Constitutional:      Appearance: She is well-developed.  HENT:     Head: Normocephalic.  Pulmonary:     Effort: Pulmonary effort is normal.  Abdominal:     General: There is no distension.  Musculoskeletal:        General: Tenderness present. No swelling. Normal range of motion.     Cervical back: Normal range of motion.     Comments: Nontender thoracic and lumbar spine to palpation  Skin:    General: Skin is warm.  Neurological:     General: No focal deficit present.     Mental Status: She is alert and oriented to  person, place, and time.    ED Results / Procedures / Treatments   Labs (all labs ordered are listed, but only abnormal results are displayed) Labs Reviewed - No data to display  EKG None  Radiology No results found.  Procedures Procedures   Medications Ordered in ED Medications - No data to display  ED Course  I have reviewed the triage vital signs and the nursing notes.  Pertinent labs & imaging results that were available during my care of the patient were reviewed by me and considered in my medical decision making (see chart for details).    MDM Rules/Calculators/A&P                          MDM:  Pt is diabetic.  I will treat with voltaren and robaxin. Pt advised to follow up with her MD for recheck.  Final Clinical Impression(s) / ED Diagnoses Final diagnoses:  Sciatica of right side    Rx / DC Orders ED  Discharge Orders          Ordered    methocarbamol (ROBAXIN) 500 MG tablet  4 times daily        11/27/20 1557    diclofenac (VOLTAREN) 75 MG EC tablet  2 times daily        11/27/20 1557          An After Visit Summary was printed and given to the patient.    Osie Cheeks 11/27/20 Tora Perches, MD 11/30/20 [phone]

## 2021-09-01 ENCOUNTER — Other Ambulatory Visit: Payer: Self-pay

## 2021-09-01 ENCOUNTER — Other Ambulatory Visit (HOSPITAL_BASED_OUTPATIENT_CLINIC_OR_DEPARTMENT_OTHER): Payer: Self-pay

## 2021-09-01 ENCOUNTER — Emergency Department (HOSPITAL_BASED_OUTPATIENT_CLINIC_OR_DEPARTMENT_OTHER)
Admission: EM | Admit: 2021-09-01 | Discharge: 2021-09-01 | Disposition: A | Discharge status: Home / Self Care | Payer: BC Managed Care – PPO | Attending: Emergency Medicine | Admitting: Emergency Medicine

## 2021-09-01 ENCOUNTER — Encounter (HOSPITAL_BASED_OUTPATIENT_CLINIC_OR_DEPARTMENT_OTHER): Payer: Self-pay

## 2021-09-01 DIAGNOSIS — E119 Type 2 diabetes mellitus without complications: Secondary | ICD-10-CM | POA: Diagnosis not present

## 2021-09-01 DIAGNOSIS — I10 Essential (primary) hypertension: Secondary | ICD-10-CM | POA: Diagnosis not present

## 2021-09-01 DIAGNOSIS — M545 Low back pain, unspecified: Secondary | ICD-10-CM | POA: Diagnosis present

## 2021-09-01 DIAGNOSIS — M5442 Lumbago with sciatica, left side: Secondary | ICD-10-CM | POA: Diagnosis not present

## 2021-09-01 MED ORDER — CYCLOBENZAPRINE HCL 10 MG PO TABS: 10.0000 mg | ORAL_TABLET | Freq: Two times a day (BID) | ORAL | 0 refills | Status: DC | PRN

## 2021-09-01 MED ORDER — KETOROLAC TROMETHAMINE 60 MG/2ML IM SOLN
60.0000 mg | Freq: Once | INTRAMUSCULAR | Status: AC
  Administered 2021-09-01: 60 mg via INTRAMUSCULAR
  Filled 2021-09-01: qty 2

## 2021-09-01 MED ORDER — CYCLOBENZAPRINE HCL 10 MG PO TABS
10.0000 mg | ORAL_TABLET | Freq: Two times a day (BID) | ORAL | 0 refills | Status: DC | PRN
  Filled 2021-09-01: qty 20, 10d supply, fill #0

## 2021-09-01 NOTE — Discharge Instructions (Signed)
Take Tylenol (acetaminophen) 1000 mg 4 times a day for 1 week. This is the maximum dose of Tylenol you can take from all sources. Please check other over-the-counter medications and prescriptions to ensure you are not taking other medications that contain acetaminophen.  You may also take ibuprofen 400 mg 6 times a day alternating with or at the same time as tylenol OR 600mg  ibuprofen 4 times a day. In addition to these medications, you can use a lidocaine patch over the counter and the flexeril prescribed.  ?

## 2021-09-01 NOTE — ED Provider Notes (Signed)
?[facility] EMERGENCY DEPARTMENT ?Provider Note ? ? ?CSN: [id number] ?Arrival date & time: 09/01/21  1027 ? ?  ? ?History ? ?Chief Complaint  ?Patient presents with  ? Back Pain  ? ? ?Adrienne Roberts is a 41 y.o. female. ? ?HPI ? ?  ?41 year old female with a history of diabetes, hypertension, prior episode of back pain, presents with concern for left-sided back pain with radiation to the left buttock. ? ?Reports recently moving and feels she may have exacerbated pain by lifting.  Pain is been present for approximately 3 to 4 days. ?Left back pain radiating to left buttock ?No falls or trauma ?No loss control bowel or bladder ?No fever ?No numbness/weakness ?Pain with bearing down ?No urinary symptoms ?No abdominal pain, menstrual cramps ? ?Tylenol and advil with some relief. ? ? ?Past Medical History:  ?Diagnosis Date  ? Arthritis   ? Diabetes mellitus without complication (HCC)   ? Hypertension   ? MRSA (methicillin resistant Staphylococcus aureus)   ?  ? ?Home Medications ?Prior to Admission medications   ?Medication Sig Start Date End Date Taking? Authorizing Provider  ?cyclobenzaprine (FLEXERIL) 10 MG tablet Take 1 tablet (10 mg total) by mouth 2 (two) times daily as needed for muscle spasms. 09/01/21   Alvira Monday, MD  ?diclofenac (VOLTAREN) 75 MG EC tablet Take 1 tablet (75 mg total) by mouth 2 (two) times daily. 11/27/20   Elson Areas, PA-C  ?methocarbamol (ROBAXIN) 500 MG tablet Take 1 tablet (500 mg total) by mouth 4 (four) times daily. 11/27/20   Elson Areas, PA-C  ?omeprazole (PRILOSEC) 20 MG capsule Take 1 capsule (20 mg total) by mouth daily. 06/05/20 07/05/20  Sponseller, Eugene Gavia, PA-C  ?   ? ?Allergies    ?Prednisone, Lisinopril, and Mobic [meloxicam]   ? ?Review of Systems   ?Review of Systems ?See above ? ?Physical Exam ?Updated Vital Signs ?BP 135/80 (BP Location: Left Arm)   Pulse 76   Temp 98.1 ?F (36.7 ?C) (Oral)   Resp 20   Ht 5\' 7"  (1.702 m)   Wt 117.9 kg   LMP  08/31/2021 (Exact Date)   SpO2 98%   BMI 40.72 kg/m?  ?Physical Exam ?Vitals and nursing note reviewed.  ?Constitutional:   ?   General: She is not in acute distress. ?   Appearance: She is well-developed. She is not diaphoretic.  ?HENT:  ?   Head: Normocephalic and atraumatic.  ?Eyes:  ?   Conjunctiva/sclera: Conjunctivae normal.  ?Cardiovascular:  ?   Rate and Rhythm: Normal rate and regular rhythm.  ?   Heart sounds: Normal heart sounds. No murmur heard. ?  No friction rub. No gallop.  ?Pulmonary:  ?   Effort: Pulmonary effort is normal. No respiratory distress.  ?   Breath sounds: Normal breath sounds. No wheezing or rales.  ?Abdominal:  ?   General: There is no distension.  ?   Palpations: Abdomen is soft.  ?   Tenderness: There is no abdominal tenderness. There is no guarding.  ?Musculoskeletal:     ?   General: No tenderness.  ?   Cervical back: Normal range of motion.  ?Skin: ?   General: Skin is warm and dry.  ?   Findings: No erythema or rash.  ?Neurological:  ?   Mental Status: She is alert and oriented to person, place, and time.  ? ? ?ED Results / Procedures / Treatments   ?Labs ?(all labs ordered are listed,  but only abnormal results are displayed) ?Labs Reviewed - No data to display ? ?EKG ?None ? ?Radiology ?No results found. ? ?Procedures ?Procedures  ? ? ?Medications Ordered in ED ?Medications  ?ketorolac (TORADOL) injection 60 mg (60 mg Intramuscular Given 09/01/21 1258)  ? ? ?ED Course/ Medical Decision Making/ A&P ?  ?                        ?Medical Decision Making ?Risk ?Prescription drug management. ? ? ?41 year old female with a history of diabetes, hypertension, prior episode of back pain, presents with concern for left-sided back pain with radiation to the left buttock. ?Patient has a normal neurologic exam and denies any urinary retention or overflow incontinence, stool incontinence, saddle anesthesia, fever, IV drug use, trauma, chronic steroid use or immunocompromise and have low  suspicion suspicion for cauda equina, fracture, epidural abscess, or vertebral osteomyelitis. No abdominal pain to suggest intraabdominal etiology. Denies dysuria, doubt urinary source. Suspect possible disc herniation related to lifting while moving given radicular nature, possible muscular strain.  Given toradol in the ED.  Declines narcotic rx for pain. Do feel risk of steroids at this time outweigh benefits with challenges controlling her DM.  Will give flexeril, recommend ibuprofen, tylenol, lidocaine patch and close PCP follow up. Patient discharged in stable condition with understanding of reasons to return.  ? ? ? ? ? ? ? ?Final Clinical Impression(s) / ED Diagnoses ?Final diagnoses:  ?Acute left-sided low back pain with left-sided sciatica  ? ? ?Rx / DC Orders ?ED Discharge Orders   ? ?      Ordered  ?  cyclobenzaprine (FLEXERIL) 10 MG tablet  2 times daily PRN,   Status:  Discontinued       ? 09/01/21 1256  ?  cyclobenzaprine (FLEXERIL) 10 MG tablet  2 times daily PRN       ? 09/01/21 1302  ? ?  ?  ? ?  ? ? ?  ?Alvira Monday, MD ?09/02/21 2111 ? ?

## 2021-09-01 NOTE — ED Triage Notes (Signed)
Pt c/o left lumbar back pain radiating to left leg. Hx of sciatica to right side, feels same. ?

## 2022-08-16 ENCOUNTER — Emergency Department (HOSPITAL_BASED_OUTPATIENT_CLINIC_OR_DEPARTMENT_OTHER)
Admission: EM | Admit: 2022-08-16 | Discharge: 2022-08-16 | Disposition: A | Discharge status: Home / Self Care | Payer: BC Managed Care – PPO | Attending: Emergency Medicine | Admitting: Emergency Medicine

## 2022-08-16 ENCOUNTER — Other Ambulatory Visit: Payer: Self-pay

## 2022-08-16 DIAGNOSIS — E1165 Type 2 diabetes mellitus with hyperglycemia: Secondary | ICD-10-CM | POA: Insufficient documentation

## 2022-08-16 DIAGNOSIS — Z7984 Long term (current) use of oral hypoglycemic drugs: Secondary | ICD-10-CM | POA: Insufficient documentation

## 2022-08-16 DIAGNOSIS — R739 Hyperglycemia, unspecified: Secondary | ICD-10-CM

## 2022-08-16 LAB — I-STAT VENOUS BLOOD GAS, ED
Acid-Base Excess: 0 mmol/L (ref 0.0–2.0)
Bicarbonate: 23.6 mmol/L (ref 20.0–28.0)
Calcium, Ion: 1.03 mmol/L — ABNORMAL LOW (ref 1.15–1.40)
HCT: 40 % (ref 36.0–46.0)
Hemoglobin: 13.6 g/dL (ref 12.0–15.0)
O2 Saturation: 84 %
Patient temperature: 98.1
Potassium: 6.8 mmol/L (ref 3.5–5.1)
Sodium: 132 mmol/L — ABNORMAL LOW (ref 135–145)
TCO2: 25 mmol/L (ref 22–32)
pCO2, Ven: 35.6 mmHg — ABNORMAL LOW (ref 44–60)
pH, Ven: 7.428 (ref 7.25–7.43)
pO2, Ven: 46 mmHg — ABNORMAL HIGH (ref 32–45)

## 2022-08-16 LAB — BASIC METABOLIC PANEL
Anion gap: 6 (ref 5–15)
BUN: 12 mg/dL (ref 6–20)
CO2: 24 mmol/L (ref 22–32)
Calcium: 8.5 mg/dL — ABNORMAL LOW (ref 8.9–10.3)
Chloride: 102 mmol/L (ref 98–111)
Creatinine, Ser: 0.94 mg/dL (ref 0.44–1.00)
GFR, Estimated: >60 mL/min (ref >60)
Glucose, Bld: 334 mg/dL — ABNORMAL HIGH (ref 70–99)
Potassium: 3.9 mmol/L (ref 3.5–5.1)
Sodium: 132 mmol/L — ABNORMAL LOW (ref 135–145)

## 2022-08-16 LAB — CBC
HCT: 35.7 % — ABNORMAL LOW (ref 36.0–46.0)
Hemoglobin: 12.1 g/dL (ref 12.0–15.0)
MCH: 23.8 pg — ABNORMAL LOW (ref 26.0–34.0)
MCHC: 33.9 g/dL (ref 30.0–36.0)
MCV: 70.3 fL — ABNORMAL LOW (ref 80.0–100.0)
Platelets: 343 10*3/uL (ref 150–400)
RBC: 5.08 MIL/uL (ref 3.87–5.11)
RDW: 16.1 % — ABNORMAL HIGH (ref 11.5–15.5)
WBC: 9.4 10*3/uL (ref 4.0–10.5)
nRBC: 0 % (ref 0.0–0.2)

## 2022-08-16 LAB — URINALYSIS, ROUTINE W REFLEX MICROSCOPIC
Bilirubin Urine: NEGATIVE
Glucose, UA: >=500 mg/dL — AB
Ketones, ur: NEGATIVE mg/dL
Leukocytes,Ua: NEGATIVE
Nitrite: NEGATIVE
Protein, ur: NEGATIVE mg/dL
Specific Gravity, Urine: 1.015 (ref 1.005–1.030)
pH: 6 (ref 5.0–8.0)

## 2022-08-16 LAB — CBG MONITORING, ED
Glucose-Capillary: 412 mg/dL — ABNORMAL HIGH (ref 70–99)
Glucose-Capillary: 257 mg/dL — ABNORMAL HIGH (ref 70–99)

## 2022-08-16 LAB — URINALYSIS, MICROSCOPIC (REFLEX)

## 2022-08-16 LAB — PREGNANCY, URINE: Preg Test, Ur: NEGATIVE

## 2022-08-16 MED ORDER — LACTATED RINGERS IV BOLUS
1000.0000 mL | Freq: Once | INTRAVENOUS | Status: AC
  Administered 2022-08-16: 1000 mL via INTRAVENOUS

## 2022-08-16 NOTE — ED Provider Notes (Signed)
[place] EMERGENCY DE[facility]MENT [facility] Provider Note   CSN: [id number] Arrival date & time: 08/16/22  1639     History  Chief Complaint  Patient presents with   Hyperglycemia    Adrienne Roberts is a 42 y.o. female.  HPI 42 year old female with a history of type 2 diabetes presents with hyperglycemia.  She is supposed to be on Ozempic, insulin and metformin but due to insurance issues is only on the metformin.  This has been an ongoing issue with her insurance company for a couple months.  Today she was at work and started to feel blurry vision and her glucose was around 430 when she checked.  She states she ate her typical foods this morning except for she had a smoothie which she does not normally have.  Otherwise her glucose typically runs in the 90-130 range.  She feels a little lightheaded as well.  She has not been recently sick and does not currently feel sick.  No urinary symptoms.  Home Medications Prior to Admission medications   Medication Sig Start Date End Date Taking? Authorizing Provider  cyclobenzaprine (FLEXERIL) 10 MG tablet Take 1 tablet (10 mg total) by mouth 2 (two) times daily as needed for muscle spasms. 09/01/21   Gareth Morgan, MD  diclofenac (VOLTAREN) 75 MG EC tablet Take 1 tablet (75 mg total) by mouth 2 (two) times daily. 11/27/20   Fransico Meadow, PA-C  methocarbamol (ROBAXIN) 500 MG tablet Take 1 tablet (500 mg total) by mouth 4 (four) times daily. 11/27/20   Fransico Meadow, PA-C  omeprazole (PRILOSEC) 20 MG capsule Take 1 capsule (20 mg total) by mouth daily. 06/05/20 07/05/20  Sponseller, Eugene Garnet R, PA-C      Allergies    Prednisone, Lisinopril, and Mobic [meloxicam]    Review of Systems   Review of Systems  Eyes:  Positive for visual disturbance.  Respiratory:  Negative for cough and shortness of breath.   Gastrointestinal:  Negative for vomiting.  Genitourinary:  Negative for dysuria.  Neurological:  Positive for  light-headedness. Negative for weakness and numbness.    Physical Exam Updated Vital Signs BP 120/75   Pulse 75   Temp 98 F (36.7 C) (Oral)   Resp 20   Ht '5\' 6"'$  (1.676 m)   Wt 120.2 kg   SpO2 99%   BMI 42.77 kg/m  Physical Exam Vitals and nursing note reviewed.  Constitutional:      General: She is not in acute distress.    Appearance: She is well-developed. She is not ill-appearing or diaphoretic.  HENT:     Head: Normocephalic and atraumatic.  Eyes:     Extraocular Movements: Extraocular movements intact.     Pupils: Pupils are equal, round, and reactive to light.  Cardiovascular:     Rate and Rhythm: Normal rate and regular rhythm.     Heart sounds: Normal heart sounds.  Pulmonary:     Effort: Pulmonary effort is normal.     Breath sounds: Normal breath sounds.  Abdominal:     Palpations: Abdomen is soft.     Tenderness: There is no abdominal tenderness.  Skin:    General: Skin is warm and dry.  Neurological:     Mental Status: She is alert.     Comments: CN 3-12 grossly intact. 5/5 strength in all 4 extremities. Grossly normal sensation. Normal finger to nose.      ED Results / Procedures / Treatments   Labs (all  labs ordered are listed, but only abnormal results are displayed) Labs Reviewed  CBC - Abnormal; Notable for the following components:      Result Value   HCT 35.7 (*)    MCV 70.3 (*)    MCH 23.8 (*)    RDW 16.1 (*)    All other components within normal limits  URINALYSIS, ROUTINE W REFLEX MICROSCOPIC - Abnormal; Notable for the following components:   Glucose, UA >=500 (*)    Hgb urine dipstick MODERATE (*)    All other components within normal limits  URINALYSIS, MICROSCOPIC (REFLEX) - Abnormal; Notable for the following components:   Bacteria, UA RARE (*)    All other components within normal limits  BASIC METABOLIC PANEL - Abnormal; Notable for the following components:   Sodium 132 (*)    Glucose, Bld 334 (*)    Calcium 8.5 (*)    All  other components within normal limits  CBG MONITORING, ED - Abnormal; Notable for the following components:   Glucose-Capillary 412 (*)    All other components within normal limits  I-STAT VENOUS BLOOD GAS, ED - Abnormal; Notable for the following components:   pCO2, Ven 35.6 (*)    pO2, Ven 46 (*)    Sodium 132 (*)    Potassium 6.8 (*)    Calcium, Ion 1.03 (*)    All other components within normal limits  CBG MONITORING, ED - Abnormal; Notable for the following components:   Glucose-Capillary 257 (*)    All other components within normal limits  PREGNANCY, URINE  CBG MONITORING, ED    EKG EKG Interpretation  Date/Time:  Monday August 16 2022 18:49:52 EST Ventricular Rate:  72 PR Interval:  157 QRS Duration: 80 QT Interval:  374 QTC Calculation: 410 R Axis:   68 Text Interpretation: Sinus rhythm no acute ST/T changes similar to 2021 Confirmed by Sherwood Gambler [phone]) on 08/16/2022 6:57:57 PM  Radiology No results found.  Procedures Procedures    Medications Ordered in ED Medications  lactated ringers bolus 1,000 mL (0 mLs Intravenous Stopped 08/16/22 1949)    ED Course/ Medical Decision Making/ A&P                             Medical Decision Making Amount and/or Complexity of Data Reviewed External Data Reviewed: notes.    Details: Telephone call from today about changing up insulin regimen Labs: ordered.    Details: Hyperglycemia without acidosis. Potassium of 6.8 was hemolyzed on the venous blood gas, normal on the repeat. ECG/medicine tests: ordered and independent interpretation performed.    Details: No acute ischemia.   Patient presents with some lightheadedness and blurry vision consistent with her hyperglycemia.  There is no evidence of DKA with a normal anion gap.  Her first potassium on the blood gas seem hemolyzed and on the recollection of BMP her potassium is normal.  She is feeling a lot better after fluids and her glucose has come down from the  400 range to 257.  She states she will start her insulin as an outpatient and declines any here.  Neuroexam is unremarkable, highly doubt this is a CNS process.  Will discharge home with return precautions.        Final Clinical Impression(s) / ED Diagnoses Final diagnoses:  Hyperglycemia    Rx / DC Orders ED Discharge Orders     None         Regenia Skeeter,  Nicki Reaper, MD 08/16/22 2329

## 2022-08-16 NOTE — ED Triage Notes (Signed)
Patient presents to ED via POV from work. Sent due to hyperglycemia. Type 2 diabetic. CBG was 430 with patients meter. Patient reports blurry vision and dizziness.

## 2022-11-22 ENCOUNTER — Other Ambulatory Visit: Payer: Self-pay

## 2022-11-22 ENCOUNTER — Emergency Department (HOSPITAL_BASED_OUTPATIENT_CLINIC_OR_DEPARTMENT_OTHER): Payer: BC Managed Care – PPO

## 2022-11-22 ENCOUNTER — Encounter (HOSPITAL_BASED_OUTPATIENT_CLINIC_OR_DEPARTMENT_OTHER): Payer: Self-pay | Admitting: Emergency Medicine

## 2022-11-22 ENCOUNTER — Emergency Department (HOSPITAL_BASED_OUTPATIENT_CLINIC_OR_DEPARTMENT_OTHER)
Admission: EM | Admit: 2022-11-22 | Discharge: 2022-11-22 | Disposition: A | Discharge status: Home / Self Care | Payer: BC Managed Care – PPO | Attending: Emergency Medicine | Admitting: Emergency Medicine

## 2022-11-22 DIAGNOSIS — N898 Other specified noninflammatory disorders of vagina: Secondary | ICD-10-CM | POA: Insufficient documentation

## 2022-11-22 DIAGNOSIS — D219 Benign neoplasm of connective and other soft tissue, unspecified: Secondary | ICD-10-CM

## 2022-11-22 DIAGNOSIS — R300 Dysuria: Secondary | ICD-10-CM | POA: Diagnosis not present

## 2022-11-22 DIAGNOSIS — I10 Essential (primary) hypertension: Secondary | ICD-10-CM | POA: Diagnosis not present

## 2022-11-22 DIAGNOSIS — R1030 Lower abdominal pain, unspecified: Secondary | ICD-10-CM | POA: Insufficient documentation

## 2022-11-22 DIAGNOSIS — E119 Type 2 diabetes mellitus without complications: Secondary | ICD-10-CM | POA: Insufficient documentation

## 2022-11-22 LAB — COMPREHENSIVE METABOLIC PANEL
ALT: 13 U/L (ref 0–44)
AST: 12 U/L — ABNORMAL LOW (ref 15–41)
Albumin: 3.6 g/dL (ref 3.5–5.0)
Alkaline Phosphatase: 55 U/L (ref 38–126)
Anion gap: 7 (ref 5–15)
BUN: 11 mg/dL (ref 6–20)
CO2: 24 mmol/L (ref 22–32)
Calcium: 8.6 mg/dL — ABNORMAL LOW (ref 8.9–10.3)
Chloride: 105 mmol/L (ref 98–111)
Creatinine, Ser: 0.89 mg/dL (ref 0.44–1.00)
GFR, Estimated: >60 mL/min (ref >60)
Glucose, Bld: 90 mg/dL (ref 70–99)
Potassium: 3.5 mmol/L (ref 3.5–5.1)
Sodium: 136 mmol/L (ref 135–145)
Total Bilirubin: 0.4 mg/dL (ref 0.3–1.2)
Total Protein: 7.4 g/dL (ref 6.5–8.1)

## 2022-11-22 LAB — CBC WITH DIFFERENTIAL/PLATELET
Abs Immature Granulocytes: 0.02 10*3/uL (ref 0.00–0.07)
Basophils Absolute: 0 10*3/uL (ref 0.0–0.1)
Basophils Relative: 0 %
Eosinophils Absolute: 0.1 10*3/uL (ref 0.0–0.5)
Eosinophils Relative: 1 %
HCT: 35.4 % — ABNORMAL LOW (ref 36.0–46.0)
Hemoglobin: 11.7 g/dL — ABNORMAL LOW (ref 12.0–15.0)
Immature Granulocytes: 0 %
Lymphocytes Relative: 36 %
Lymphs Abs: 3.6 10*3/uL (ref 0.7–4.0)
MCH: 23.5 pg — ABNORMAL LOW (ref 26.0–34.0)
MCHC: 33.1 g/dL (ref 30.0–36.0)
MCV: 71.1 fL — ABNORMAL LOW (ref 80.0–100.0)
Monocytes Absolute: 0.7 10*3/uL (ref 0.1–1.0)
Monocytes Relative: 6 %
Neutro Abs: 5.7 10*3/uL (ref 1.7–7.7)
Neutrophils Relative %: 57 %
Platelets: 392 10*3/uL (ref 150–400)
RBC: 4.98 MIL/uL (ref 3.87–5.11)
RDW: 16.7 % — ABNORMAL HIGH (ref 11.5–15.5)
WBC: 10.2 10*3/uL (ref 4.0–10.5)
nRBC: 0 % (ref 0.0–0.2)

## 2022-11-22 LAB — URINALYSIS, ROUTINE W REFLEX MICROSCOPIC
Bilirubin Urine: NEGATIVE
Glucose, UA: NEGATIVE mg/dL
Hgb urine dipstick: NEGATIVE
Ketones, ur: NEGATIVE mg/dL
Nitrite: NEGATIVE
Protein, ur: NEGATIVE mg/dL
Specific Gravity, Urine: 1.025 (ref 1.005–1.030)
pH: 7.5 (ref 5.0–8.0)

## 2022-11-22 LAB — URINALYSIS, MICROSCOPIC (REFLEX)

## 2022-11-22 LAB — WET PREP, GENITAL
Clue Cells Wet Prep HPF POC: NONE SEEN
Sperm: NONE SEEN
Trich, Wet Prep: NONE SEEN
WBC, Wet Prep HPF POC: >=10 — AB (ref <10)
Yeast Wet Prep HPF POC: NONE SEEN

## 2022-11-22 LAB — PREGNANCY, URINE: Preg Test, Ur: NEGATIVE

## 2022-11-22 LAB — LIPASE, BLOOD: Lipase: 40 U/L (ref 11–51)

## 2022-11-22 MED ORDER — IOHEXOL 300 MG/ML  SOLN
125.0000 mL | Freq: Once | INTRAMUSCULAR | Status: AC | PRN
  Administered 2022-11-22: 125 mL via INTRAVENOUS

## 2022-11-22 MED ORDER — CEPHALEXIN 250 MG PO CAPS: 250.0000 mg | ORAL_CAPSULE | Freq: Four times a day (QID) | ORAL | 0 refills | Status: DC

## 2022-11-22 NOTE — ED Notes (Signed)
Pt. Reports she started on Ozempic and has had bad diarrhea since.  In the last 24 hours she has had 3 episodes.  Pt. Has had no vomiting.  Pt. In no distress and states she should have eaten before coming to the ED.

## 2022-11-22 NOTE — Discharge Instructions (Addendum)
Please take your antibiotics as prescribed. I recommend close follow-up with OB-GYN for reevaluation.  Please do not hesitate to return to emergency department if worrisome signs symptoms we discussed become apparent.

## 2022-11-22 NOTE — ED Provider Notes (Signed)
[name] EMERGENCY DEPARTMENT AT [facility] Provider Note   CSN: [id number] Arrival date & time: 11/22/22  1236     History  Chief Complaint  Patient presents with   Dysuria   Vaginal Discharge    Adrienne Roberts is a 42 y.o. female with a PMHx of DM, HTN, who presents to the ED with concerns for malodorous white vaginal discharge onset 2 days. Denies any new sexual partners or concerns for STI today. Doesn't want HIV/RPR testing. Has associated dysuria. No meds tried PTA. Started Ozempic 2 weeks ago prior to the onset of her lower abdominal pain and diarrhea. Denies hematuria, vaginal bleeding, fever.   The history is provided by the patient. No language interpreter was used.       Home Medications Prior to Admission medications   Medication Sig Start Date End Date Taking? Authorizing Provider  cyclobenzaprine (FLEXERIL) 10 MG tablet Take 1 tablet (10 mg total) by mouth 2 (two) times daily as needed for muscle spasms. 09/01/21   Alvira Monday, MD  diclofenac (VOLTAREN) 75 MG EC tablet Take 1 tablet (75 mg total) by mouth 2 (two) times daily. 11/27/20   Elson Areas, PA-C  methocarbamol (ROBAXIN) 500 MG tablet Take 1 tablet (500 mg total) by mouth 4 (four) times daily. 11/27/20   Elson Areas, PA-C  omeprazole (PRILOSEC) 20 MG capsule Take 1 capsule (20 mg total) by mouth daily. 06/05/20 07/05/20  Sponseller, Eugene Gavia, PA-C      Allergies    Prednisone, Lisinopril, and Mobic [meloxicam]    Review of Systems   Review of Systems  All other systems reviewed and are negative.   Physical Exam Updated Vital Signs BP (!) 142/83 (BP Location: Left Arm)   Pulse 72   Temp 98.7 F (37.1 C)   Resp 18   Ht 5' 6.5" (1.689 m)   Wt 121.1 kg   LMP 10/24/2022   SpO2 97%   BMI 42.45 kg/m  Physical Exam Vitals and nursing note reviewed. Exam conducted with a chaperone present.  Constitutional:      General: She is not in acute distress.    Appearance: Normal  appearance. She is not ill-appearing, toxic-appearing or diaphoretic.  HENT:     Head: Normocephalic and atraumatic.     Right Ear: External ear normal.     Left Ear: External ear normal.  Eyes:     General: No scleral icterus.    Extraocular Movements: Extraocular movements intact.  Cardiovascular:     Rate and Rhythm: Normal rate and regular rhythm.     Pulses: Normal pulses.     Heart sounds: Normal heart sounds.  Pulmonary:     Effort: Pulmonary effort is normal. No respiratory distress.     Breath sounds: Normal breath sounds.  Abdominal:     General: Abdomen is flat. Bowel sounds are normal. There is no distension.     Palpations: Abdomen is soft. There is no mass.     Tenderness: There is abdominal tenderness in the right lower quadrant and suprapubic area.     Hernia: There is no hernia in the left inguinal area or right inguinal area.     Comments: TTP noted to suprapubic and RLQ region  Genitourinary:    Pubic Area: No rash.      Labia:        Right: No rash, tenderness, lesion or injury.        Left: No rash, tenderness, lesion or  injury.      Vagina: No signs of injury and foreign body. Vaginal discharge present. No erythema, tenderness or bleeding.     Cervix: Normal.     Uterus: Normal. Not deviated, not enlarged, not fixed and not tender.      Adnexa: Right adnexa normal and left adnexa normal.     Comments: NT chaperone present for exam. TTP noted to cervix Musculoskeletal:        General: Normal range of motion.     Cervical back: Normal range of motion and neck supple.  Lymphadenopathy:     Lower Body: No right inguinal adenopathy. No left inguinal adenopathy.  Skin:    General: Skin is warm and dry.  Neurological:     Mental Status: She is alert.     ED Results / Procedures / Treatments   Labs (all labs ordered are listed, but only abnormal results are displayed) Labs Reviewed  WET PREP, GENITAL - Abnormal; Notable for the following components:       Result Value   WBC, Wet Prep HPF POC >=10 (*)    All other components within normal limits  URINALYSIS, ROUTINE W REFLEX MICROSCOPIC - Abnormal; Notable for the following components:   Leukocytes,Ua TRACE (*)    All other components within normal limits  URINALYSIS, MICROSCOPIC (REFLEX) - Abnormal; Notable for the following components:   Bacteria, UA RARE (*)    All other components within normal limits  CBC WITH DIFFERENTIAL/PLATELET - Abnormal; Notable for the following components:   Hemoglobin 11.7 (*)    HCT 35.4 (*)    MCV 71.1 (*)    MCH 23.5 (*)    RDW 16.7 (*)    All other components within normal limits  COMPREHENSIVE METABOLIC PANEL - Abnormal; Notable for the following components:   Calcium 8.6 (*)    AST 12 (*)    All other components within normal limits  PREGNANCY, URINE  LIPASE, BLOOD  GC/CHLAMYDIA PROBE AMP ([place]) NOT AT [facility]    EKG None  Radiology No results found.  Procedures Procedures    Medications Ordered in ED Medications  iohexol (OMNIPAQUE) 300 MG/ML solution 125 mL (125 mLs Intravenous Contrast Given 11/22/22 1757)    ED Course/ Medical Decision Making/ A&P                             Medical Decision Making Amount and/or Complexity of Data Reviewed Labs: ordered. Radiology: ordered.  Risk Prescription drug management.   Pt presents with concerns for vaginal discharge onset 2 days. Vital signs, pr afebrile. On exam, pt with NT chaperone present for exam. No appreciable vaginal discharge noted. TTP noted to suprapubic and RLQ region. No acute cardiovascular, respiratory, abdominal exam findings. Differential diagnosis includes appendicitis, acute cystitis, ovarian torsion/cyst, fibroids, BV, yeast.    Co morbidities that complicate the patient evaluation: DM, HTN   Additional history obtained:  External records from outside source obtained and reviewed including: Pt had a MRI pelvis completed on 09/10/22 that showed concerns  for enlarged fibroid uterus with indexed fibroids. Fibroid uterus with approximately 10 fibroids, the dominant of which are indexed below. No hemorrhage. Of note a 4 cm posterior uterine fibroid within the lower uterine segment indents the mid rectum. Dominant fibroids with submucosal involvement: FIGO type 1 fibroid measures 1.0 cm and the left eccentric mid uterine endometrium (3/22) without associated enhancement. No aggressive features. FIGO type 2 fibroid measures  4.2 x 4.2 x 4.0 cm with T2 hyperintense signal and associated avid, homogenous enhancement (4/31) arises from the posterior left mid uterine body. No aggressive features. Dominant fibroids without submucosal involvement: There is a FIGO type 3 fibroid (4/39) within the right uterine body which measures 4.1 x 3.4 x 3.6 cm and demonstrates peripheral enhancement. No aggressive features. FIGO type VII fibroid (4/12) arises from the right eccentric uterine fundus and measures 11.0 x 16.8 x 11.9 cm with associated patchy heterogenous enhancement. No aggressive features. FIGO type IV fibroid measures 4.8 x 6.7 x 5.3 cm in the anterior fundal uterus with mild heterogenous enhancement (4/35). No aggressive features.    Labs:  I ordered, and personally interpreted labs.  The pertinent results include:   Negative pregnancy urine UA with trace of leukocytes.  Wet prep with >10 WBC otherwise negative GC/Chlamydia ordered with results pending at time of sign out CBC with hgb at 11.7 stable from previous CBC otherwise unremarkable CMP unremarkable Lipase unremarkable  Imaging: I ordered imaging studies including CT AP pelvis ordered with results pending at time of sign out  Patient case discussed with Jeanelle Malling, PA-C at sign-out. Plan at sign-out is pending CT AP, likely Discharge home, however, plans may change as per oncoming team. Patient care transferred at sign out.    This chart was dictated using voice recognition software, Dragon. Despite  the best efforts of this provider to proofread and correct errors, errors may still occur which can change documentation meaning.  Final Clinical Impression(s) / ED Diagnoses Final diagnoses:  None    Rx / DC Orders ED Discharge Orders     None         Leiland Mihelich A, PA-C 11/22/22 1844    Virgina Norfolk, DO 11/22/22 1945

## 2022-11-22 NOTE — ED Provider Notes (Signed)
Patient's care assumed at shift change from [facility], [place].  For full HPI, please refer to previous providers note.  Plan is to reassess after having CT scan result.  Physical Exam  BP 129/69   Pulse 72   Temp 98 F (36.7 C) (Oral)   Resp 18   Ht 5' 6.5" (1.689 m)   Wt 121.1 kg   LMP 10/24/2022   SpO2 100%   BMI 42.45 kg/m   Physical Exam Vitals and nursing note reviewed.  Constitutional:      Appearance: Normal appearance.  HENT:     Head: Normocephalic and atraumatic.     Mouth/Throat:     Mouth: Mucous membranes are moist.  Eyes:     General: No scleral icterus. Cardiovascular:     Rate and Rhythm: Normal rate and regular rhythm.     Pulses: Normal pulses.     Heart sounds: Normal heart sounds.  Pulmonary:     Effort: Pulmonary effort is normal.     Breath sounds: Normal breath sounds.  Abdominal:     General: Abdomen is flat.     Palpations: Abdomen is soft.     Tenderness: There is abdominal tenderness in the right lower quadrant and suprapubic area.  Musculoskeletal:        General: No deformity.  Skin:    General: Skin is warm.     Findings: No rash.  Neurological:     General: No focal deficit present.     Mental Status: She is alert.  Psychiatric:        Mood and Affect: Mood normal.     Procedures  Procedures  ED Course / MDM    Medical Decision Making Amount and/or Complexity of Data Reviewed Labs: ordered. Radiology: ordered.  Risk Prescription drug management.   42 year old female presents with dysuria, abnormal vaginal discharge and lower abdominal pain for 2 days.  Pelvic exam done by previous EDP showed no appreciable vaginal discharge.  CT abdomen pelvis show markedly enlarged uterus with numerous fibroids, largest measuring 17 cm.  Of note, patient had an MRI done on 09/10/2022 that showed multiple fibroids, the largest measure 11 x 16.8 x 11.9 cm with patchy heterogenous enhancement.  Patient states she was evaluated by OB/GYN who  recommended myomectomy however the procedure has never been scheduled.  Given CT scan results, I have low suspicion for other intra-abdominal emergency such as appendicitis, diverticulitis, SBO, cholecystitis.  Patient is well-appearing, able to tolerate p.o, vital signs are normal.  States she does not have any pain at this point.  She is stable for discharge.  Advised patient to follow-up with OB/GYN for further evaluation and management.  Strict return precaution discussed.  Disposition Continued outpatient therapy. Follow-up with OB/GYN recommended for reevaluation of symptoms. Treatment plan discussed with patient.  Pt acknowledged understanding was agreeable to the plan. Worrisome signs and symptoms were discussed with patient, and patient acknowledged understanding to return to the ED if they noticed these signs and symptoms. Patient was stable upon discharge.   This chart was dictated using voice recognition software.  Despite best efforts to proofread,  errors can occur which can change the documentation meaning.        Jeanelle Malling, PA 11/22/22 2025    Virgina Norfolk, DO 11/22/22 2251

## 2022-11-22 NOTE — ED Triage Notes (Signed)
Patient arrives ambulatory by POV c/o lower abdominal pain and vaginal discharge x 2 days. Also having dysuria and foul odor to urine. Reports white discharge.

## 2022-11-23 LAB — GC/CHLAMYDIA PROBE AMP (~~LOC~~) NOT AT ARMC
Chlamydia: NEGATIVE
Comment: NEGATIVE
Comment: NORMAL
Neisseria Gonorrhea: NEGATIVE

## 2023-02-08 ENCOUNTER — Other Ambulatory Visit: Payer: Self-pay

## 2023-02-08 ENCOUNTER — Emergency Department (HOSPITAL_BASED_OUTPATIENT_CLINIC_OR_DEPARTMENT_OTHER)
Admission: EM | Admit: 2023-02-08 | Discharge: 2023-02-08 | Disposition: A | Discharge status: Home / Self Care | Payer: BC Managed Care – PPO | Attending: Emergency Medicine | Admitting: Emergency Medicine

## 2023-02-08 ENCOUNTER — Encounter (HOSPITAL_BASED_OUTPATIENT_CLINIC_OR_DEPARTMENT_OTHER): Payer: Self-pay

## 2023-02-08 DIAGNOSIS — D259 Leiomyoma of uterus, unspecified: Secondary | ICD-10-CM | POA: Diagnosis not present

## 2023-02-08 DIAGNOSIS — R1084 Generalized abdominal pain: Secondary | ICD-10-CM | POA: Diagnosis present

## 2023-02-08 HISTORY — DX: Benign neoplasm of connective and other soft tissue, unspecified: D21.9

## 2023-02-08 LAB — CBC WITH DIFFERENTIAL/PLATELET
Abs Immature Granulocytes: 0.03 10*3/uL (ref 0.00–0.07)
Basophils Absolute: 0.1 10*3/uL (ref 0.0–0.1)
Basophils Relative: 1 %
Eosinophils Absolute: 0.1 10*3/uL (ref 0.0–0.5)
Eosinophils Relative: 1 %
HCT: 32.2 % — ABNORMAL LOW (ref 36.0–46.0)
Hemoglobin: 10.6 g/dL — ABNORMAL LOW (ref 12.0–15.0)
Immature Granulocytes: 0 %
Lymphocytes Relative: 35 %
Lymphs Abs: 3.8 10*3/uL (ref 0.7–4.0)
MCH: 22.7 pg — ABNORMAL LOW (ref 26.0–34.0)
MCHC: 32.9 g/dL (ref 30.0–36.0)
MCV: 69.1 fL — ABNORMAL LOW (ref 80.0–100.0)
Monocytes Absolute: 0.7 10*3/uL (ref 0.1–1.0)
Monocytes Relative: 6 %
Neutro Abs: 6.2 10*3/uL (ref 1.7–7.7)
Neutrophils Relative %: 57 %
Platelets: 464 10*3/uL — ABNORMAL HIGH (ref 150–400)
RBC: 4.66 MIL/uL (ref 3.87–5.11)
RDW: 16.6 % — ABNORMAL HIGH (ref 11.5–15.5)
WBC: 10.8 10*3/uL — ABNORMAL HIGH (ref 4.0–10.5)
nRBC: 0 % (ref 0.0–0.2)

## 2023-02-08 LAB — BASIC METABOLIC PANEL
Anion gap: 12 (ref 5–15)
BUN: 10 mg/dL (ref 6–20)
CO2: 21 mmol/L — ABNORMAL LOW (ref 22–32)
Calcium: 8.7 mg/dL — ABNORMAL LOW (ref 8.9–10.3)
Chloride: 101 mmol/L (ref 98–111)
Creatinine, Ser: 0.92 mg/dL (ref 0.44–1.00)
GFR, Estimated: >60 mL/min (ref >60)
Glucose, Bld: 135 mg/dL — ABNORMAL HIGH (ref 70–99)
Potassium: 3.4 mmol/L — ABNORMAL LOW (ref 3.5–5.1)
Sodium: 134 mmol/L — ABNORMAL LOW (ref 135–145)

## 2023-02-08 LAB — URINALYSIS, ROUTINE W REFLEX MICROSCOPIC
Bilirubin Urine: NEGATIVE
Glucose, UA: NEGATIVE mg/dL
Hgb urine dipstick: NEGATIVE
Ketones, ur: NEGATIVE mg/dL
Leukocytes,Ua: NEGATIVE
Nitrite: NEGATIVE
Protein, ur: NEGATIVE mg/dL
Specific Gravity, Urine: 1.025 (ref 1.005–1.030)
pH: 6 (ref 5.0–8.0)

## 2023-02-08 LAB — PREGNANCY, URINE: Preg Test, Ur: NEGATIVE

## 2023-02-08 MED ORDER — HYDROCODONE-ACETAMINOPHEN 5-325 MG PO TABS: 1.0000 | ORAL_TABLET | Freq: Four times a day (QID) | ORAL | 0 refills | Status: DC | PRN

## 2023-02-08 MED ORDER — IBUPROFEN 400 MG PO TABS
600.0000 mg | ORAL_TABLET | Freq: Once | ORAL | Status: AC
  Administered 2023-02-08: 600 mg via ORAL
  Filled 2023-02-08: qty 1

## 2023-02-08 NOTE — ED Provider Notes (Signed)
[place] [facility] AT [facility]  Provider Note  CSN: [id number] Arrival date & time: 02/08/23 2039  History Chief Complaint  Patient presents with   Abdominal Pain    Adrienne Roberts is a 42 y.o. female with history of multiple large uterine fibroids, initially saw Gyn about doing a myomectomy in Jan 2024, but was lost to follow up and will not be able to seen there until October. She reports diffuse lower abdominal pain for a few days. She feels like she may have a UTI. She has been evaluated for dysuria and vaginitis multiple times in recent weeks with negative workups each time. Also had CT here in June without acute findings.    Home Medications Prior to Admission medications   Medication Sig Start Date End Date Taking? Authorizing Provider  HYDROcodone-acetaminophen (NORCO/VICODIN) 5-325 MG tablet Take 1 tablet by mouth every 6 (six) hours as needed for severe pain. 02/08/23  Yes Pollyann Savoy, MD  cephALEXin (KEFLEX) 250 MG capsule Take 1 capsule (250 mg total) by mouth 4 (four) times daily. 11/22/22   Jeanelle Malling, PA  cyclobenzaprine (FLEXERIL) 10 MG tablet Take 1 tablet (10 mg total) by mouth 2 (two) times daily as needed for muscle spasms. 09/01/21   Alvira Monday, MD  diclofenac (VOLTAREN) 75 MG EC tablet Take 1 tablet (75 mg total) by mouth 2 (two) times daily. 11/27/20   Elson Areas, PA-C  methocarbamol (ROBAXIN) 500 MG tablet Take 1 tablet (500 mg total) by mouth 4 (four) times daily. 11/27/20   Elson Areas, PA-C  omeprazole (PRILOSEC) 20 MG capsule Take 1 capsule (20 mg total) by mouth daily. 06/05/20 07/05/20  Sponseller, Eugene Gavia, PA-C     Allergies    Prednisone, Lisinopril, and Mobic [meloxicam]   Review of Systems   Review of Systems Please see HPI for pertinent positives and negatives  Physical Exam BP (!) 145/73   Pulse 83   Temp 98 F (36.7 C) (Temporal)   Resp 18   Ht 5\' 7"  (1.702 m)   Wt 117 kg   LMP 01/20/2023  (Exact Date)   SpO2 97%   BMI 40.41 kg/m   Physical Exam Vitals and nursing note reviewed.  Constitutional:      Appearance: Normal appearance.  HENT:     Head: Normocephalic and atraumatic.     Nose: Nose normal.     Mouth/Throat:     Mouth: Mucous membranes are moist.  Eyes:     Extraocular Movements: Extraocular movements intact.     Conjunctiva/sclera: Conjunctivae normal.  Cardiovascular:     Rate and Rhythm: Normal rate.  Pulmonary:     Effort: Pulmonary effort is normal.     Breath sounds: Normal breath sounds.  Abdominal:     General: Abdomen is flat.     Palpations: Abdomen is soft. There is mass (uterine).     Tenderness: There is generalized abdominal tenderness. There is no guarding. Negative signs include Murphy's sign and McBurney's sign.  Musculoskeletal:        General: No swelling. Normal range of motion.     Cervical back: Neck supple.  Skin:    General: Skin is warm and dry.     Findings: No rash (on exposed skin).  Neurological:     General: No focal deficit present.     Mental Status: She is alert and oriented to person, place, and time.  Psychiatric:  Mood and Affect: Mood normal.     ED Results / Procedures / Treatments   EKG None  Procedures Procedures  Medications Ordered in the ED Medications  ibuprofen (ADVIL) tablet 600 mg (has no administration in time range)    Initial Impression and Plan  Patient here with pain associated with uterine fibroids. She also reports possible UTI. Abdomen is benign, vitals are reassuring. Labs done in triage show unremarkable CBC, BMP, UA and HCG. Patient reassured no signs of UTI. Plan Rx for pain medications for breakthrough pain and outpatient Gyn follow up for definitive care.   ED Course       MDM Rules/Calculators/A&P Medical Decision Making Problems Addressed: Uterine leiomyoma, unspecified location: chronic illness or injury with exacerbation, progression, or side effects of  treatment  Amount and/or Complexity of Data Reviewed Labs: ordered. Decision-making details documented in ED Course.  Risk Prescription drug management.     Final Clinical Impression(s) / ED Diagnoses Final diagnoses:  Uterine leiomyoma, unspecified location    Rx / DC Orders ED Discharge Orders          Ordered    HYDROcodone-acetaminophen (NORCO/VICODIN) 5-325 MG tablet  Every 6 hours PRN        02/08/23 2310             Pollyann Savoy, MD 02/08/23 2310

## 2023-02-08 NOTE — ED Triage Notes (Addendum)
Pt here today for abdominal pain.  States she 9 uterine fibroids and is supposed to be scheduled for sx and is awaiting a call back Pt states the pain started yesterday. +dysuria Denies n/v States she does have a mucous discharge

## 2023-08-15 ENCOUNTER — Encounter (HOSPITAL_BASED_OUTPATIENT_CLINIC_OR_DEPARTMENT_OTHER): Payer: Self-pay

## 2023-08-15 ENCOUNTER — Other Ambulatory Visit: Payer: Self-pay

## 2023-08-15 DIAGNOSIS — D72829 Elevated white blood cell count, unspecified: Secondary | ICD-10-CM | POA: Diagnosis not present

## 2023-08-15 DIAGNOSIS — R112 Nausea with vomiting, unspecified: Secondary | ICD-10-CM | POA: Diagnosis present

## 2023-08-15 DIAGNOSIS — R197 Diarrhea, unspecified: Secondary | ICD-10-CM | POA: Insufficient documentation

## 2023-08-15 LAB — COMPREHENSIVE METABOLIC PANEL
ALT: 13 U/L (ref 0–44)
AST: 17 U/L (ref 15–41)
Albumin: 4 g/dL (ref 3.5–5.0)
Alkaline Phosphatase: 53 U/L (ref 38–126)
Anion gap: 13 (ref 5–15)
BUN: 9 mg/dL (ref 6–20)
CO2: 19 mmol/L — ABNORMAL LOW (ref 22–32)
Calcium: 9.3 mg/dL (ref 8.9–10.3)
Chloride: 104 mmol/L (ref 98–111)
Creatinine, Ser: 0.81 mg/dL (ref 0.44–1.00)
GFR, Estimated: >60 mL/min (ref >60)
Glucose, Bld: 142 mg/dL — ABNORMAL HIGH (ref 70–99)
Potassium: 3.6 mmol/L (ref 3.5–5.1)
Sodium: 136 mmol/L (ref 135–145)
Total Bilirubin: 0.8 mg/dL (ref 0.0–1.2)
Total Protein: 8.5 g/dL — ABNORMAL HIGH (ref 6.5–8.1)

## 2023-08-15 LAB — CBC
HCT: 37.2 % (ref 36.0–46.0)
Hemoglobin: 11.7 g/dL — ABNORMAL LOW (ref 12.0–15.0)
MCH: 20.1 pg — ABNORMAL LOW (ref 26.0–34.0)
MCHC: 31.5 g/dL (ref 30.0–36.0)
MCV: 63.9 fL — ABNORMAL LOW (ref 80.0–100.0)
Platelets: 559 10*3/uL — ABNORMAL HIGH (ref 150–400)
RBC: 5.82 MIL/uL — ABNORMAL HIGH (ref 3.87–5.11)
RDW: 19.4 % — ABNORMAL HIGH (ref 11.5–15.5)
WBC: 15.6 10*3/uL — ABNORMAL HIGH (ref 4.0–10.5)
nRBC: 0 % (ref 0.0–0.2)

## 2023-08-15 LAB — LIPASE, BLOOD: Lipase: 37 U/L (ref 11–51)

## 2023-08-15 MED ORDER — ONDANSETRON HCL 4 MG/2ML IJ SOLN
4.0000 mg | Freq: Once | INTRAMUSCULAR | Status: AC
  Administered 2023-08-15: 4 mg via INTRAVENOUS
  Filled 2023-08-15: qty 2

## 2023-08-15 NOTE — ED Triage Notes (Signed)
 Cc: nausea/vomting and diarrhea with abdominal pain x1 day

## 2023-08-16 ENCOUNTER — Emergency Department (HOSPITAL_BASED_OUTPATIENT_CLINIC_OR_DEPARTMENT_OTHER)
Admission: EM | Admit: 2023-08-16 | Discharge: 2023-08-16 | Disposition: A | Discharge status: Home / Self Care | Attending: Emergency Medicine | Admitting: Emergency Medicine

## 2023-08-16 DIAGNOSIS — R112 Nausea with vomiting, unspecified: Secondary | ICD-10-CM

## 2023-08-16 MED ORDER — ONDANSETRON HCL 4 MG/2ML IJ SOLN
4.0000 mg | Freq: Once | INTRAMUSCULAR | Status: AC
  Administered 2023-08-16: 4 mg via INTRAVENOUS
  Filled 2023-08-16: qty 2

## 2023-08-16 MED ORDER — MORPHINE SULFATE (PF) 4 MG/ML IV SOLN
4.0000 mg | Freq: Once | INTRAVENOUS | Status: AC
  Administered 2023-08-16: 4 mg via INTRAVENOUS
  Filled 2023-08-16: qty 1

## 2023-08-16 MED ORDER — SODIUM CHLORIDE 0.9 % IV BOLUS
1000.0000 mL | Freq: Once | INTRAVENOUS | Status: AC
  Administered 2023-08-16: 1000 mL via INTRAVENOUS

## 2023-08-16 MED ORDER — ONDANSETRON 4 MG PO TBDP: ORAL_TABLET | ORAL | 0 refills | Status: DC

## 2023-08-16 NOTE — Discharge Instructions (Addendum)
 Take the medication for nausea.  You can take imodium for diarrhea.  This is over the counter. Return for worsening pain, or inability to eat or drink.

## 2023-08-16 NOTE — ED Provider Notes (Signed)
 [place] [facility] Provider Note   CSN: [id number] Arrival date & time: 08/15/23  2232     History  Chief Complaint  Patient presents with   Nausea   Vomiting    Adrienne Roberts is a 43 y.o. female.  43 yo F with a chief complaints of nausea vomiting and diarrhea.  Started about 7 PM tonight.  She had had [employer] for lunch and felt a little bit uncomfortable shortly thereafter.  No one else sick that she knows of.  No cough congestion or fever.  Epigastric abdominal discomfort.        Home Medications Prior to Admission medications   Medication Sig Start Date End Date Taking? Authorizing Provider  ondansetron (ZOFRAN-ODT) 4 MG disintegrating tablet 4mg  ODT q4 hours prn nausea/vomit 08/16/23  Yes Melene Plan, DO  cephALEXin (KEFLEX) 250 MG capsule Take 1 capsule (250 mg total) by mouth 4 (four) times daily. 11/22/22   Jeanelle Malling, PA  cyclobenzaprine (FLEXERIL) 10 MG tablet Take 1 tablet (10 mg total) by mouth 2 (two) times daily as needed for muscle spasms. 09/01/21   Alvira Monday, MD  diclofenac (VOLTAREN) 75 MG EC tablet Take 1 tablet (75 mg total) by mouth 2 (two) times daily. 11/27/20   Elson Areas, PA-C  HYDROcodone-acetaminophen (NORCO/VICODIN) 5-325 MG tablet Take 1 tablet by mouth every 6 (six) hours as needed for severe pain. 02/08/23   Pollyann Savoy, MD  methocarbamol (ROBAXIN) 500 MG tablet Take 1 tablet (500 mg total) by mouth 4 (four) times daily. 11/27/20   Elson Areas, PA-C  omeprazole (PRILOSEC) 20 MG capsule Take 1 capsule (20 mg total) by mouth daily. 06/05/20 07/05/20  Sponseller, Eugene Gavia, PA-C      Allergies    Prednisone, Lisinopril, and Mobic [meloxicam]    Review of Systems   Review of Systems  Physical Exam Updated Vital Signs BP (!) 153/87 (BP Location: Right Arm)   Pulse 97   Temp 99.1 F (37.3 C) (Oral)   Resp 18   Ht 5\' 7"  (1.702 m)   Wt 109.8 kg   LMP 07/30/2023   SpO2 96%   BMI 37.90 kg/m   Physical Exam Vitals and nursing note reviewed.  Constitutional:      General: She is not in acute distress.    Appearance: She is well-developed. She is not diaphoretic.  HENT:     Head: Normocephalic and atraumatic.  Eyes:     Pupils: Pupils are equal, round, and reactive to light.  Cardiovascular:     Rate and Rhythm: Normal rate and regular rhythm.     Heart sounds: No murmur heard.    No friction rub. No gallop.  Pulmonary:     Effort: Pulmonary effort is normal.     Breath sounds: No wheezing or rales.  Abdominal:     General: There is no distension.     Palpations: Abdomen is soft.     Tenderness: There is abdominal tenderness.     Comments: Epigastric   Musculoskeletal:        General: No tenderness.     Cervical back: Normal range of motion and neck supple.  Skin:    General: Skin is warm and dry.  Neurological:     Mental Status: She is alert and oriented to person, place, and time.  Psychiatric:        Behavior: Behavior normal.     ED Results / Procedures / Treatments  Labs (all labs ordered are listed, but only abnormal results are displayed) Labs Reviewed  COMPREHENSIVE METABOLIC PANEL - Abnormal; Notable for the following components:      Result Value   CO2 19 (*)    Glucose, Bld 142 (*)    Total Protein 8.5 (*)    All other components within normal limits  CBC - Abnormal; Notable for the following components:   WBC 15.6 (*)    RBC 5.82 (*)    Hemoglobin 11.7 (*)    MCV 63.9 (*)    MCH 20.1 (*)    RDW 19.4 (*)    Platelets 559 (*)    All other components within normal limits  LIPASE, BLOOD  URINALYSIS, ROUTINE W REFLEX MICROSCOPIC  PREGNANCY, URINE    EKG None  Radiology No results found.  Procedures Procedures    Medications Ordered in ED Medications  ondansetron (ZOFRAN) injection 4 mg (4 mg Intravenous Given 08/15/23 2251)  sodium chloride 0.9 % bolus 1,000 mL (1,000 mLs Intravenous New Bag/Given 08/16/23 0219)  ondansetron  (ZOFRAN) injection 4 mg (4 mg Intravenous Given 08/16/23 0213)  morphine (PF) 4 MG/ML injection 4 mg (4 mg Intravenous Given 08/16/23 0213)    ED Course/ Medical Decision Making/ A&P                                 Medical Decision Making Amount and/or Complexity of Data Reviewed Labs: ordered.  Risk Prescription drug management.   43 yo F with a chief complaints of nausea vomiting and diarrhea.  Started abruptly this evening.  Complaining of some epigastric pain post.  Most likely viral syndrome.  LFTs and lipase are unremarkable.  Mild leukocytosis.  Will give a bolus of IV fluids treat pain and nausea reassess.  Feeling better on repeat evaluation.   3:22 AM:  I have discussed the diagnosis/risks/treatment options with the patient.  Evaluation and diagnostic testing in the emergency department does not suggest an emergent condition requiring admission or immediate intervention beyond what has been performed at this time.  They will follow up with PCP. We also discussed returning to the ED immediately if new or worsening sx occur. We discussed the sx which are most concerning (e.g., sudden worsening pain, fever, inability to tolerate by mouth) that necessitate immediate return. Medications administered to the patient during their visit and any new prescriptions provided to the patient are listed below.  Medications given during this visit Medications  ondansetron (ZOFRAN) injection 4 mg (4 mg Intravenous Given 08/15/23 2251)  sodium chloride 0.9 % bolus 1,000 mL (1,000 mLs Intravenous New Bag/Given 08/16/23 0219)  ondansetron (ZOFRAN) injection 4 mg (4 mg Intravenous Given 08/16/23 0213)  morphine (PF) 4 MG/ML injection 4 mg (4 mg Intravenous Given 08/16/23 0213)     The patient appears reasonably screen and/or stabilized for discharge and I doubt any other medical condition or other [facility] requiring further screening, evaluation, or treatment in the ED at this time prior to discharge.              Final Clinical Impression(s) / ED Diagnoses Final diagnoses:  Nausea vomiting and diarrhea    Rx / DC Orders ED Discharge Orders          Ordered    ondansetron (ZOFRAN-ODT) 4 MG disintegrating tablet        08/16/23 0320  Melene Plan, DO 08/16/23 [phone]

## 2023-09-27 ENCOUNTER — Ambulatory Visit (INDEPENDENT_AMBULATORY_CARE_PROVIDER_SITE_OTHER): Admitting: Podiatry

## 2023-09-27 DIAGNOSIS — B351 Tinea unguium: Secondary | ICD-10-CM | POA: Diagnosis not present

## 2023-09-27 NOTE — Patient Instructions (Signed)
 Fungal Nail Infection A fungal nail infection is a common infection of the toenails or fingernails. This condition affects toenails more often than fingernails. It often affects the great, or big, toes. More than one nail may be infected. The condition can be passed from person to person (is contagious). What are the causes? This condition is caused by a fungus, such as yeast or molds. Several types of fungi can cause the infection. These fungi are common in moist and warm areas. If your hands or feet come into contact with the fungus, it may get into a crack in your fingernail or toenail or in the surrounding skin, and cause an infection. What increases the risk? The following factors may make you more likely to develop this condition: Being of older age. Having certain medical conditions, such as: Athlete's foot. Diabetes. Poor circulation. A weak body defense system (immune system). Walking barefoot in areas where the fungus thrives, such as showers or locker rooms. Wearing shoes and socks that cause your feet to sweat. Having a nail injury or a recent nail surgery. What are the signs or symptoms? Symptoms of this condition include: A pale spot on the nail. Thickening of the nail. A nail that becomes yellow, brown, or white. A brittle or ragged nail edge. A nail that has lifted away from the nail bed. How is this diagnosed? This condition is diagnosed with a physical exam. Your health care provider may take a scraping or clipping from your nail to test for the fungus. How is this treated? Treatment is not needed for mild infections. If you have significant nail changes, treatment may include: Antifungal medicines taken by mouth (orally). You may need to take the medicine for several weeks or several months, and you may not see the results for a long time. These medicines can cause side effects. Ask your health care provider what problems to watch for. Antifungal nail polish or nail  cream. These may be used along with oral antifungal medicines. Laser treatment of the nail. Surgery to remove the nail. This may be needed for the most severe infections. It can take a long time, usually up to a year, for the infection to go away. The infection may also come back. Follow these instructions at home: Medicines Take or apply over-the-counter and prescription medicines only as told by your health care provider. Ask your health care provider about using over-the-counter mentholated ointment on your nails. Nail care Trim your nails often. Wash and dry your hands and feet every day. Keep your feet dry. To do this: Wear absorbent socks, and change your socks frequently. Wear shoes that allow air to circulate, such as sandals or canvas tennis shoes. Throw out old shoes. If you go to a nail salon, make sure you choose one that uses clean instruments. Use antifungal foot powder on your feet and in your shoes. General instructions Do not share personal items, such as towels or nail clippers. Do not walk barefoot in shower rooms or locker rooms. Wear rubber gloves if you are working with your hands in wet areas. Keep all follow-up visits. This is important. Contact a health care provider if: You have redness, pain, or pus near the toenail or fingernail. Your infection is not getting better, or it is getting worse after several months. You have more circulation problems near the toenail or fingernail. You have brown or black discoloration of the nail that spreads to the surrounding skin. Summary A fungal nail infection is a common  infection of the toenails or fingernails. Treatment is not needed for mild infections. If you have significant nail changes, treatment may include taking medicine orally and applying medicine to your nails. It can take a long time, usually up to a year, for the infection to go away. The infection may also come back. Take or apply over-the-counter and  prescription medicines only as told by your health care provider. This information is not intended to replace advice given to you by your health care provider. Make sure you discuss any questions you have with your health care provider. Document Revised: 09/01/2020 Document Reviewed: 09/01/2020 Elsevier Patient Education  2024 [employer]  Diabetes Mellitus and Foot Care Diabetes, also called diabetes mellitus, may cause problems with your feet and legs because of poor blood flow (circulation). Poor circulation may make your skin: Become thinner and drier. Break more easily. Heal more slowly. Peel and crack. You may also have nerve damage (neuropathy). This can cause decreased feeling in your legs and feet. This means that you may not notice minor injuries to your feet that could lead to more serious problems. Finding and treating problems early is the best way to prevent future foot problems. How to care for your feet Foot hygiene  Wash your feet daily with warm water and mild soap. Do not use hot water. Then, pat your feet and the areas between your toes until they are fully dry. Do not soak your feet. This can dry your skin. Trim your toenails straight across. Do not dig under them or around the cuticle. File the edges of your nails with an emery board or nail file. Apply a moisturizing lotion or petroleum jelly to the skin on your feet and to dry, brittle toenails. Use lotion that does not contain alcohol and is unscented. Do not apply lotion between your toes. Shoes and socks Wear clean socks or stockings every day. Make sure they are not too tight. Do not wear knee-high stockings. These may decrease blood flow to your legs. Wear shoes that fit well and have enough cushioning. Always look in your shoes before you put them on to be sure there are no objects inside. To break in new shoes, wear them for just a few hours a day. This prevents injuries on your feet. Wounds, scrapes, corns,  and calluses  Check your feet daily for blisters, cuts, bruises, sores, and redness. If you cannot see the bottom of your feet, use a mirror or ask someone for help. Do not cut off corns or calluses or try to remove them with medicine. If you find a minor scrape, cut, or break in the skin on your feet, keep it and the skin around it clean and dry. You may clean these areas with mild soap and water. Do not clean the area with peroxide, alcohol, or iodine. If you have a wound, scrape, corn, or callus on your foot, look at it several times a day to make sure it is healing and not infected. Check for: Redness, swelling, or pain. Fluid or blood. Warmth. Pus or a bad smell. General tips Do not cross your legs. This may decrease blood flow to your feet. Do not use heating pads or hot water bottles on your feet. They may burn your skin. If you have lost feeling in your feet or legs, you may not know this is happening until it is too late. Protect your feet from hot and cold by wearing shoes, such as at the beach  or on hot pavement. Schedule a complete foot exam at least once a year or more often if you have foot problems. Report any cuts, sores, or bruises to your health care provider right away. Where to find more information American Diabetes Association: diabetes.org Association of Diabetes Care & Education Specialists: diabeteseducator.org Contact a health care provider if: You have a condition that increases your risk of infection, and you have any cuts, sores, or bruises on your feet. You have an injury that is not healing. You have redness on your legs or feet. You feel burning or tingling in your legs or feet. You have pain or cramps in your legs and feet. Your legs or feet are numb. Your feet always feel cold. You have pain around any toenails. Get help right away if: You have a wound, scrape, corn, or callus on your foot and: You have signs of infection. You have a fever. You have a  red line going up your leg. This information is not intended to replace advice given to you by your health care provider. Make sure you discuss any questions you have with your health care provider. Document Revised: 12/02/2021 Document Reviewed: 12/02/2021 Elsevier Patient Education  2024 [employer].

## 2023-09-27 NOTE — Progress Notes (Signed)
  Subjective:  Patient ID: Adrienne Roberts, female    DOB: Feb 01, 1981,  MRN: [id number]  Chief Complaint  Patient presents with   Nail Problem    RM#13 Bilateral big toe nails splitting concerned due to diabetes.    Discussed the use of AI scribe software for clinical note transcription with the patient, who gave verbal consent to proceed.  History of Present Illness The patient, a diabetic, presents with split toenails on both big toes. She has a history of covering the splits with acrylic during pedicures, but has recently stopped this practice due to concerns about worsening the condition. She has a family history of similar nail issues, with her mother developing gangrene that led to amputation. She has been managing her diabetes well, with a recent A1c of 6.3 and fasting blood sugars around 98. She has recently stopped taking insulin and Ozempic following a myomectomy, but continues to take metformin. She has also lost significant weight, going from 283 pounds to 240 pounds. She has noticed some discoloration around the nails, but this has since resolved.       Objective:    Physical Exam General: AAO x3, NAD  Dermatological: Bilateral hallux nails are mildly hypertrophic and dystrophic with dystrophy noted.  There is a vertical ridge present to bilateral nails distally.  There is no edema, erythema, drainage or concerning signs of infection otherwise.  No open lesions.  Vascular: Dorsalis Pedis artery and Posterior Tibial artery pedal pulses are 2/4 bilateral with immedate capillary fill time.  There is no pain with calf compression, swelling, warmth, erythema.   Neruologic: Grossly intact via light touch bilateral.   Musculoskeletal: No significant pain on exam today.  Gait: Unassisted, Nonantalgic.     Results  LABS Blood Glucose: 98 (09/26/2023) HbA1c: 6.3   Assessment:   1. Dermatophytosis of nail      Plan:  Patient was evaluated and treated and all questions  answered.  Assessment and Plan Assessment & Plan Onychoschizia Splitting of big toenails, likely due to fungal or bacterial infection or nail damage. Acrylic nails may worsen condition. No autoimmune or psoriasis signs. Discussed oral antifungal risks, including low liver risk. Topical options available pending culture. - Obtain nail clippings for culture and biopsy.  Sharply debrided bilateral hallux nail specimens for culture, pathology to [place].  - Advise against acrylic nails and nail polish. - Recommend over-the-counter nail strengthener to start.   Type 2 Diabetes Mellitus Good blood sugar control with A1c of 6.3. Continues metformin. Family history of diabetes complications.  Has adequate circulation today on exam. - Advise regular blood sugar monitoring and maintain glycemic control. - Instruct daily foot inspection and report changes immediately.  Follow-up Awaiting nail culture and biopsy results. - Contact with results via MyChart or phone in 1-2 weeks.  Charity Conch DPM

## 2023-10-11 ENCOUNTER — Other Ambulatory Visit: Payer: Self-pay | Admitting: Podiatry

## 2023-10-14 NOTE — Progress Notes (Signed)
 Spoke with patient made aware of results is using nail polish and is happy with results .

## 2023-12-26 ENCOUNTER — Emergency Department (HOSPITAL_BASED_OUTPATIENT_CLINIC_OR_DEPARTMENT_OTHER)
Admission: EM | Admit: 2023-12-26 | Discharge: 2023-12-27 | Disposition: A | Discharge status: Home / Self Care | Attending: Emergency Medicine | Admitting: Emergency Medicine

## 2023-12-26 ENCOUNTER — Other Ambulatory Visit: Payer: Self-pay

## 2023-12-26 ENCOUNTER — Emergency Department (HOSPITAL_BASED_OUTPATIENT_CLINIC_OR_DEPARTMENT_OTHER)

## 2023-12-26 ENCOUNTER — Encounter (HOSPITAL_BASED_OUTPATIENT_CLINIC_OR_DEPARTMENT_OTHER): Payer: Self-pay

## 2023-12-26 DIAGNOSIS — E119 Type 2 diabetes mellitus without complications: Secondary | ICD-10-CM | POA: Insufficient documentation

## 2023-12-26 DIAGNOSIS — I10 Essential (primary) hypertension: Secondary | ICD-10-CM | POA: Diagnosis not present

## 2023-12-26 DIAGNOSIS — M25511 Pain in right shoulder: Secondary | ICD-10-CM | POA: Diagnosis present

## 2023-12-26 DIAGNOSIS — Y990 Civilian activity done for income or pay: Secondary | ICD-10-CM | POA: Insufficient documentation

## 2023-12-26 DIAGNOSIS — X500XXA Overexertion from strenuous movement or load, initial encounter: Secondary | ICD-10-CM | POA: Diagnosis not present

## 2023-12-26 NOTE — ED Triage Notes (Signed)
 Pt reports right shoulder pain since Friday with no known injury. Pt reports pain radiates to fingers and increases with movement.

## 2023-12-27 MED ORDER — NAPROXEN 250 MG PO TABS
500.0000 mg | ORAL_TABLET | Freq: Once | ORAL | Status: AC
  Administered 2023-12-27: 500 mg via ORAL
  Filled 2023-12-27: qty 2

## 2023-12-27 MED ORDER — NAPROXEN 500 MG PO TABS: 500.0000 mg | ORAL_TABLET | Freq: Two times a day (BID) | ORAL | 0 refills | Status: DC

## 2023-12-27 NOTE — ED Provider Notes (Signed)
 [place] [facility] Provider Note   CSN: [id number] Arrival date & time: 12/26/23  2302     Patient presents with: Shoulder Pain   Adrienne Roberts is a 43 y.o. female.   The history is provided by the patient.  Shoulder Pain She has history of hypertension, diabetes and comes in complaining of right shoulder pain for the last 4 days.  Pain feels like it is in the joint and sometimes radiates down her arm and her fingers sometimes get numb for a brief period of time.  She denies any trauma but she does a lot of lifting at work including overhead lifting.  She has also noted some intermittent twinges in her left shoulder.  She has been taken acetaminophen  which does seem to give slight relief.   Prior to Admission medications   Medication Sig Start Date End Date Taking? Authorizing Provider  cephALEXin  (KEFLEX ) 250 MG capsule Take 1 capsule (250 mg total) by mouth 4 (four) times daily. Patient not taking: Reported on 09/27/2023 11/22/22   Ladora Congress, PA  cyclobenzaprine  (FLEXERIL ) 10 MG tablet Take 1 tablet (10 mg total) by mouth 2 (two) times daily as needed for muscle spasms. Patient not taking: Reported on 09/27/2023 09/01/21   Dreama Longs, MD  diclofenac  (VOLTAREN ) 75 MG EC tablet Take 1 tablet (75 mg total) by mouth 2 (two) times daily. 11/27/20   Sofia, Leslie K, PA-C  HYDROcodone -acetaminophen  (NORCO/VICODIN) 5-325 MG tablet Take 1 tablet by mouth every 6 (six) hours as needed for severe pain. Patient not taking: Reported on 09/27/2023 02/08/23   Roselyn Carlin NOVAK, MD  methocarbamol  (ROBAXIN ) 500 MG tablet Take 1 tablet (500 mg total) by mouth 4 (four) times daily. Patient not taking: Reported on 09/27/2023 11/27/20   Sofia, Leslie K, PA-C  omeprazole  (PRILOSEC) 20 MG capsule Take 1 capsule (20 mg total) by mouth daily. 06/05/20 07/05/20  Sponseller, Pleasant R, PA-C  ondansetron  (ZOFRAN -ODT) 4 MG disintegrating tablet 4mg  ODT q4 hours prn  nausea/vomit Patient not taking: Reported on 09/27/2023 08/16/23   Floyd, Dan, DO    Allergies: Prednisone, Lisinopril, and Mobic  [meloxicam ]    Review of Systems  All other systems reviewed and are negative.   Updated Vital Signs BP (!) 140/94   Pulse 78   Temp 98.2 F (36.8 C) (Oral)   Resp 18   Ht 5' 8 (1.727 m)   Wt 108.9 kg   LMP 12/06/2023 (Approximate)   SpO2 98%   BMI 36.49 kg/m   Physical Exam Vitals and nursing note reviewed.   43 year old female, resting comfortably and in no acute distress. Vital signs are significant for borderline elevated blood pressure. Oxygen saturation is 98%, which is normal. Head is normocephalic and atraumatic. PERRLA, EOMI.  Neck is nontender and supple. Lungs are clear without rales, wheezes, or rhonchi. Chest is nontender. Heart has regular rate and rhythm without murmur. Abdomen is soft, flat, nontender. Extremities: There is no swelling or deformity.  There is tenderness to palpation in the right anterior deltoid groove on the right.  Full passive range of motion is present of the right shoulder.  Rotator cuff impingement signs are present x 3 on the right, not on the left.  Radial pulses strong, capillary refill is prompt.  There is normal strength of distal musculature and normal sensation.  There is full range of motion all other joints without pain. Skin is warm and dry without rash. Neurologic: Awake and alert,  moves all extremities equally.   Radiology: DG Shoulder Right Result Date: 12/26/2023 CLINICAL DATA:  Right shoulder pain EXAM: RIGHT SHOULDER - 2+ VIEW COMPARISON:  None Available. FINDINGS: There is no evidence of fracture or dislocation. There is no evidence of arthropathy or other focal bone abnormality. Soft tissues are unremarkable. IMPRESSION: Negative. Electronically Signed   By: Dorethia Molt M.D.   On: 12/26/2023 23:32     Procedures   Medications Ordered in the ED  naproxen  (NAPROSYN ) tablet 500 mg (has no  administration in time range)                                    Medical Decision Making Amount and/or Complexity of Data Reviewed Radiology: ordered.   Right shoulder pain with exam strongly suggestive of rotator cuff injury.  X-rays of the right shoulder are normal.  I have independently viewed the images, and agree with the radiologist's interpretation.  I have ordered a dose of naproxen  in the emergency department and then discharging her with a prescription for naproxen .  I have advised her to apply ice and add acetaminophen  as needed.  I have referred her to orthopedics for further evaluation and treatment.     Final diagnoses:  Acute pain of right shoulder    ED Discharge Orders          Ordered    naproxen  (NAPROSYN ) 500 MG tablet  2 times daily        12/27/23 0141               Raford Lenis, MD 12/27/23 0147

## 2023-12-27 NOTE — Discharge Instructions (Signed)
 Apply ice for 30 minutes at a time, 4 times a day.  In addition to naproxen , you may take acetaminophen .  Acetaminophen  works on pain in a different way, and combining it with naproxen  gives you better pain relief than you get from taking either medication by itself.

## 2024-01-26 NOTE — Anesthesia Preprocedure Evaluation (Signed)
 Patient: Adrienne Roberts  Procedure Information     Date/Time: 01/26/24 0700   Scheduled providers: Shanna JINNY Denmark, MD   Procedure: COLONOSCOPY   Location: [facility] - OUTPATIENT ENDOSCOPY [facility]       Relevant Problems  CARDIOVASCULAR  (+) Essential hypertension    ENDOCRINE  (+) Type 2 diabetes mellitus with hyperglycemia, with long-term current use of insulin (HCC)    BP 132/82   Pulse 72   Temp 97.7 F (36.5 C) (Skin)   Resp 16   Ht 1.727 m (5' 8)   Wt 109 kg (240 lb)   SpO2 98%   BMI 36.49 kg/m    Clinical information reviewed:  Tobacco  Allergies  Meds  Med Hx  Surg Hx  Fam Hx  Soc Hx     Anesthesia Evaluation  Anesthesia History: History of obstructive sleep apnea.  PONV Predictive Score (Scale 0-5):  Apfel risk score: 0 Respiratory: Patient has sleep apnea and cough/congestion. Additional comments: 1 ppd smoker   Cardiovascular: Patient has high blood pressure and valvular problems/murmurs (asymptomatic murmur). Patient has no angina/chest pain and no CAD.  Neurological: negative neuro ROS.  Musculoskeletal: negative musculoskeletal ROS.  Renal/Gastrointestinal: negative GI/hepatic ROS.  Endocrine: Patient has type 2 diabetes.  Hematology/Oncology: negative hematology/oncology ROS.     Physical Exam  Airway  Mallampati: II TM Distance (FB): 3 Oral Aperture (FB): 3 Face: wide face and large tongue Neck ROM: full ROM Cardiovascular - normal exam Pulmonary - normal exam   Anesthesia Plan  Review Preop documentation reviewed: NPO Status Reviewed and Periop Tests and Results Reviewed Comments: ATTENDING ATTESTATION I have perfomed my preanesthesia assessment and reevaluated this patient immediately prior to this procedure. I have  reviewed the patient's medical,  surgical, anesthesic history and confirmed surgical site and  blood product availability as it applies. I have  reviewed current medication(s), drug  allergies, lab results, ancillary studies and consultations within the EMR.  Medication guidelines appropriate for surgical urgency have been followed. I have confirmed NPO status as applicable to the relative urgency of this case.   I have confirmed the ASA status of this patient. Anesthesia risks, benefits and alternatives have been discussed with this  patient or the patient's legal representative.   I have discussed the anesthetic plan with the Anesthesia Resident and/or CRNA and/or SRNA  prior to initiating this anesthetic.   Lab Results      Component                Value               Date                      WBC                      6.50                09/20/2023                HGB                      12.5                09/20/2023                HCT  39.4                09/20/2023                MCV                      75.6 (L)            09/20/2023                PLT                      379                 09/20/2023             Chemistry         Component                Value               Date/Time                 NA                       142                 09/20/2023 1450           K                        5.0                 09/20/2023 1450           CL                       105                 09/20/2023 1450           CO2                      31                  09/20/2023 1450           BUN                      12                  09/20/2023 1450           CREATININE               0.77                09/20/2023 1450             Component                Value               Date/Time                 CALCIUM                  9.6                 09/20/2023 1450           AST  13                  09/20/2023 1450           ALT                      16                  09/20/2023 1450           BILITOT                  0.4                 09/20/2023 1450         Plan ASA score: 3  Anesthesia type: general Induction:  intravenous  Informed Consent Anesthetic plan and risks discussed with patient. Plan discussed with CRNA.   Date of Last Liquid: 01/25/24 Time of last liquid: 2000 Date of Last Solid: 01/25/24 Time of last solid: 0800

## 2024-02-28 NOTE — Progress Notes (Signed)
 [name] is a 43 year old female who returns for management of DM2.   She has family history of DM2 in her mother.  Her mother died of complications related to DM. She presented to acute care in 08/2020 with urinary frequency. Her A1C was 12.9% in 08/2020.  This confirmed her diagnosis of DM2. Upon chart review, she had elevated sugars on two occasions in December.   She was initially seen in 10/2020. Her A1C was >15%.   She was last seen in 08/2023.   Her A1C at last check was 6.3%.  Her A1C is currently 5.7%.  She has had increased stress recently.  She had gyn surgery in march.  She is taking OZEMPIC 0.5 mg weekly on Mondays.  She is taking TOUJEO 30 units twice daily.  She is taking metformin twice daily.     She brought her meter. She is checking her sugars once daily. Her sugars have ranged from 67-145. She can feel low sugars.  She denies polyuria. She denies polydipsia. She denies nocturia.  The patient problem list, PMHx, Surgical Hx, Med list, Allergies, Family history and Personal History were reviewed during this visit.  ROS: GEN: no fever, + weight loss CV: no chest pain, + SOB- she is smoking again PULM: no cough, + DOE GI: no nausea or vomiting, no abdominal pain GU: no dysuria, no hematuria NEURO: no HA, no syncope  PE: General- well nourished, no distress, obese Neck: thyroid not enlarged , non tender Eyes: sclera clear, conjunctiva clear Lymph nodes: no LAD Lungs: B CTA, normal respiratory movement CV: no JVD, heart RRR Abdomen: soft, NTTP, bowel sounds present MSK: normal movement of extremities, no swelling of legs Neuro: oriented, no motor dysfunction, reflexes normal Skin: no lesions  ASSESSMENT AND PLAN:  She had a positive depression screen.  She does not want a referral at this time - it is due to life stress.  DM2 - -  A1C is 5.7% EXCELLENT CONTROL Check sugars once daily Lower TOUJEO to 25-30 units twice daily Continue  OZEMPIC weekly - discussed potential increase to 1 mg, discussed carb reduction and calorie restriction She has no history of pancreatitis Continue metformin ER twice daily   Hypertension- BP at goal, continue losartan  Obesity - BMI improved to 35, her weight is down since last visit, we discussed carb reduction and exercise today   Follow up in four months

## 2024-03-09 ENCOUNTER — Encounter (HOSPITAL_BASED_OUTPATIENT_CLINIC_OR_DEPARTMENT_OTHER): Payer: Self-pay | Admitting: Emergency Medicine

## 2024-03-09 ENCOUNTER — Emergency Department (HOSPITAL_BASED_OUTPATIENT_CLINIC_OR_DEPARTMENT_OTHER)

## 2024-03-09 ENCOUNTER — Other Ambulatory Visit: Payer: Self-pay

## 2024-03-09 ENCOUNTER — Emergency Department (HOSPITAL_BASED_OUTPATIENT_CLINIC_OR_DEPARTMENT_OTHER)
Admission: EM | Admit: 2024-03-09 | Discharge: 2024-03-09 | Disposition: A | Discharge status: Home / Self Care | Attending: Emergency Medicine | Admitting: Emergency Medicine

## 2024-03-09 DIAGNOSIS — R1031 Right lower quadrant pain: Secondary | ICD-10-CM | POA: Insufficient documentation

## 2024-03-09 DIAGNOSIS — E119 Type 2 diabetes mellitus without complications: Secondary | ICD-10-CM | POA: Insufficient documentation

## 2024-03-09 DIAGNOSIS — I10 Essential (primary) hypertension: Secondary | ICD-10-CM | POA: Insufficient documentation

## 2024-03-09 LAB — URINALYSIS, ROUTINE W REFLEX MICROSCOPIC
Bilirubin Urine: NEGATIVE
Glucose, UA: NEGATIVE mg/dL
Hgb urine dipstick: NEGATIVE
Ketones, ur: NEGATIVE mg/dL
Leukocytes,Ua: NEGATIVE
Nitrite: NEGATIVE
Protein, ur: NEGATIVE mg/dL
Specific Gravity, Urine: 1.025 (ref 1.005–1.030)
pH: 7 (ref 5.0–8.0)

## 2024-03-09 LAB — COMPREHENSIVE METABOLIC PANEL WITH GFR
ALT: 6 U/L (ref 0–44)
AST: 10 U/L — ABNORMAL LOW (ref 15–41)
Albumin: 4.2 g/dL (ref 3.5–5.0)
Alkaline Phosphatase: 59 U/L (ref 38–126)
Anion gap: 12 (ref 5–15)
BUN: 7 mg/dL (ref 6–20)
CO2: 23 mmol/L (ref 22–32)
Calcium: 9.3 mg/dL (ref 8.9–10.3)
Chloride: 106 mmol/L (ref 98–111)
Creatinine, Ser: 0.78 mg/dL (ref 0.44–1.00)
GFR, Estimated: >60 mL/min (ref >60)
Glucose, Bld: 112 mg/dL — ABNORMAL HIGH (ref 70–99)
Potassium: 3.8 mmol/L (ref 3.5–5.1)
Sodium: 140 mmol/L (ref 135–145)
Total Bilirubin: 0.3 mg/dL (ref 0.0–1.2)
Total Protein: 7.1 g/dL (ref 6.5–8.1)

## 2024-03-09 LAB — CBC
HCT: 38.3 % (ref 36.0–46.0)
Hemoglobin: 13.3 g/dL (ref 12.0–15.0)
MCH: 26 pg (ref 26.0–34.0)
MCHC: 34.7 g/dL (ref 30.0–36.0)
MCV: 75 fL — ABNORMAL LOW (ref 80.0–100.0)
Platelets: 401 K/uL — ABNORMAL HIGH (ref 150–400)
RBC: 5.11 MIL/uL (ref 3.87–5.11)
RDW: 14.2 % (ref 11.5–15.5)
WBC: 7.9 K/uL (ref 4.0–10.5)
nRBC: 0 % (ref 0.0–0.2)

## 2024-03-09 LAB — LIPASE, BLOOD: Lipase: 36 U/L (ref 11–51)

## 2024-03-09 LAB — PREGNANCY, URINE: Preg Test, Ur: NEGATIVE

## 2024-03-09 MED ORDER — DIPHENHYDRAMINE HCL 50 MG/ML IJ SOLN: 50.0000 mg | Freq: Once | INTRAMUSCULAR | Status: AC

## 2024-03-09 MED ORDER — NAPROXEN 500 MG PO TABS: 500.0000 mg | ORAL_TABLET | Freq: Two times a day (BID) | ORAL | 0 refills | Status: AC

## 2024-03-09 MED ORDER — DIPHENHYDRAMINE HCL 50 MG/ML IJ SOLN
INTRAMUSCULAR | Status: AC
  Administered 2024-03-09: 50 mg via INTRAVENOUS
  Filled 2024-03-09: qty 1

## 2024-03-09 MED ORDER — MORPHINE SULFATE (PF) 4 MG/ML IV SOLN
4.0000 mg | Freq: Once | INTRAVENOUS | Status: AC
  Administered 2024-03-09: 4 mg via INTRAVENOUS
  Filled 2024-03-09: qty 1

## 2024-03-09 MED ORDER — SODIUM CHLORIDE 0.9 % IV BOLUS
1000.0000 mL | Freq: Once | INTRAVENOUS | Status: AC
  Administered 2024-03-09: 1000 mL via INTRAVENOUS

## 2024-03-09 MED ORDER — METHYLPREDNISOLONE SODIUM SUCC 125 MG IJ SOLR
INTRAMUSCULAR | Status: AC
  Filled 2024-03-09: qty 2

## 2024-03-09 MED ORDER — DICYCLOMINE HCL 20 MG PO TABS: 20.0000 mg | ORAL_TABLET | Freq: Two times a day (BID) | ORAL | 0 refills | Status: AC

## 2024-03-09 MED ORDER — IOHEXOL 300 MG/ML  SOLN
100.0000 mL | Freq: Once | INTRAMUSCULAR | Status: AC | PRN
  Administered 2024-03-09: 100 mL via INTRAVENOUS

## 2024-03-09 MED ORDER — ONDANSETRON HCL 4 MG/2ML IJ SOLN
4.0000 mg | Freq: Once | INTRAMUSCULAR | Status: AC
  Administered 2024-03-09: 4 mg via INTRAVENOUS
  Filled 2024-03-09: qty 2

## 2024-03-09 NOTE — ED Triage Notes (Signed)
 Pt c/o RLQ pain, urinary frequency since this AM.  Denies fever, n/v/d/discharge.

## 2024-03-09 NOTE — ED Provider Notes (Signed)
 [place] [facility] Provider Note   CSN: [id number] Arrival date & time: 03/09/24  1307     Patient presents with: Urinary Frequency and Abdominal Pain   Adrienne Roberts is a 43 y.o. female patient with past medical history of HTN, DM, fibroids is reporting to ER with complaint of RLQ pain for about 1 day. She reports that she has had pain like this before and she does not know what it is. This seems to be associated with urinary urgency but no dysuria.  Worse while moving like sitting up or laying down. Patient does not have any dysuria, chills, fever, nausea vomiting diarrhea.  Admits to having normal appetite. Has colonoscopy 1 month ago. Had fibroidectomy 6 months ago.     Urinary Frequency Associated symptoms include abdominal pain.  Abdominal Pain      Prior to Admission medications   Medication Sig Start Date End Date Taking? Authorizing Provider  naproxen  (NAPROSYN ) 500 MG tablet Take 1 tablet (500 mg total) by mouth 2 (two) times daily. 12/27/23   Raford Lenis, MD  omeprazole  (PRILOSEC) 20 MG capsule Take 1 capsule (20 mg total) by mouth daily. 06/05/20 07/05/20  Sponseller, Pleasant JONELLE, PA-C    Allergies: Morphine , Prednisone, Zofran  [ondansetron ], Lisinopril, and Mobic  [meloxicam ]    Review of Systems  Gastrointestinal:  Positive for abdominal pain.  Genitourinary:  Positive for frequency.    Updated Vital Signs BP 122/84 (BP Location: Right Arm)   Pulse 65   Temp 98 F (36.7 C) (Oral)   Resp 16   Ht 5' 8 (1.727 m)   Wt 106.6 kg   LMP 02/18/2024 (Approximate)   SpO2 96%   BMI 35.73 kg/m   Physical Exam Vitals and nursing note reviewed.  Constitutional:      General: She is not in acute distress.    Appearance: She is not toxic-appearing.  HENT:     Head: Normocephalic and atraumatic.  Eyes:     General: No scleral icterus.    Conjunctiva/sclera: Conjunctivae normal.  Cardiovascular:     Rate and Rhythm: Normal  rate and regular rhythm.     Pulses: Normal pulses.     Heart sounds: Normal heart sounds.  Pulmonary:     Effort: Pulmonary effort is normal. No respiratory distress.     Breath sounds: Normal breath sounds.  Abdominal:     General: Abdomen is flat. Bowel sounds are normal.     Palpations: Abdomen is soft.     Tenderness: There is abdominal tenderness in the right lower quadrant. There is guarding.  Skin:    General: Skin is warm and dry.     Findings: No lesion.  Neurological:     General: No focal deficit present.     Mental Status: She is alert and oriented to person, place, and time. Mental status is at baseline.     (all labs ordered are listed, but only abnormal results are displayed) Labs Reviewed  COMPREHENSIVE METABOLIC PANEL WITH GFR - Abnormal; Notable for the following components:      Result Value   Glucose, Bld 112 (*)    AST 10 (*)    All other components within normal limits  CBC - Abnormal; Notable for the following components:   MCV 75.0 (*)    Platelets 401 (*)    All other components within normal limits  URINALYSIS, ROUTINE W REFLEX MICROSCOPIC - Abnormal; Notable for the following components:   APPearance HAZY (*)  All other components within normal limits  LIPASE, BLOOD  PREGNANCY, URINE    EKG: None  Radiology: CT ABDOMEN PELVIS W CONTRAST Result Date: 03/09/2024 EXAM: CT ABDOMEN AND PELVIS WITH CONTRAST 03/09/2024 03:17:31 PM TECHNIQUE: CT of the abdomen and pelvis was performed with the administration of 100 mL of iohexol  (OMNIPAQUE ) 300 MG/ML solution. Multiplanar reformatted images are provided for review. Automated exposure control, iterative reconstruction, and/or weight-based adjustment of the mA/kV was utilized to reduce the radiation dose to as low as reasonably achievable. COMPARISON: 11/22/2022 CLINICAL HISTORY: RLQ abdominal pain and urinary frequency since this AM. FINDINGS: LOWER CHEST: Dependent linear subsegmental atelectasis in the  posterior basal segments of both lower lobes. LIVER: Stable small scattered hypodense lesions in the liver are too small to characterize although statistically likely would be small cysts or similar benign lesions. No further imaging follow up of these lesions is indicated. GALLBLADDER AND BILE DUCTS: Gallbladder is unremarkable. No biliary ductal dilatation. SPLEEN: No acute abnormality. PANCREAS: No acute abnormality. ADRENAL GLANDS: No acute abnormality. KIDNEYS, URETERS AND BLADDER: Small bilateral renal hypodense lesions are technically too small to characterize although statistically likely to be benign. No further imaging follow up of these lesions is indicated. No stones in the kidneys or ureters. No hydronephrosis. No perinephric or periureteral stranding. Urinary bladder is unremarkable. GI AND BOWEL: Normal appendix. No dilated bowel. Stomach demonstrates no acute abnormality. There is no bowel obstruction. PERITONEUM AND RETROPERITONEUM: No ascites. No free air. VASCULATURE: Aorta is normal in caliber. LYMPH NODES: No lymphadenopathy. REPRODUCTIVE ORGANS: Uterine prominence was suspected fibroids, overall the uterus measures 13.1 x 8.5 x 8.1 cm which is substantially reduced from the size and appearance on 11/22/2022. The calculated uterine volume assuming ellipsoid morphology is approximately 473.4 cm\S\3. BONES AND SOFT TISSUES: 1 small umbilical hernia contains adipose tissue. No acute osseous abnormality. No focal soft tissue abnormality. IMPRESSION: 1. No acute findings in the abdomen or pelvis related to RLQ abdominal pain and urinary frequency. Normal appendix. 2. Substantially reduced uterine size with suspected fibroids compared to 11/22/22. 3. Dependent linear subsegmental atelectasis in the posterior basal segments of both lower lobes. 4. Small umbilical hernia containing adipose tissue. Electronically signed by: Ryan Salvage MD 03/09/2024 03:50 PM EDT RP Workstation: [id number]      Procedures   Medications Ordered in the ED  methylPREDNISolone  sodium succinate (SOLU-MEDROL ) 125 mg/2 mL injection (0 mg  Hold 03/09/24 1505)  sodium chloride  0.9 % bolus 1,000 mL (1,000 mLs Intravenous New Bag/Given 03/09/24 1443)  ondansetron  (ZOFRAN ) injection 4 mg (4 mg Intravenous Given 03/09/24 1441)  morphine  (PF) 4 MG/ML injection 4 mg (4 mg Intravenous Given 03/09/24 1441)  iohexol  (OMNIPAQUE ) 300 MG/ML solution 100 mL (100 mLs Intravenous Contrast Given 03/09/24 1510)  diphenhydrAMINE  (BENADRYL ) injection 50 mg (50 mg Intravenous Given 03/09/24 1500)    Clinical Course as of 03/09/24 1652  Fri Mar 09, 2024  1456 Hives on right arm with diffuse itching after morphine , zofran  give. Will given Bendryl and Solumedrol. No anaphylaxis. [JB]  1621 Reassessed continues to have improvement of itching/rash. No SOB, throat swelling. LCTAB, stable vs. [JB]    Clinical Course User Index [JB] Arcenia Scarbro, Warren SAILOR, PA-C                                 Medical Decision Making Amount and/or Complexity of Data Reviewed Labs: ordered. Radiology: ordered.  Risk Prescription drug management.   This  patient presents to the ED for concern of abdominal pain, this involves an extensive number of treatment options, and is a complaint that carries with it a high risk of complications and morbidity.  The differential diagnosis includes cholecystitis, AAA, appendicitis, renal stone, UTI   Co morbidities that complicate the patient evaluation  DM2 HTN   Lab Tests:  I personally interpreted labs.  The pertinent results include:   No leukocytosis.  No anemia.  CMP is unremarkable.  Lipase is unremarkable.  UA negative.  Pregnancy test is negative   Imaging Studies ordered:  I ordered imaging studies including CT scan of abdomen and pelvis I independently visualized and interpreted imaging which showed no acute findings in the abdomen or pelvis.  It does show substantially reduced uterine  size (fibroids),  I agree with the radiologist interpretation   Cardiac Monitoring: / EKG:  The patient was maintained on a cardiac monitor.     Problem List / ED Course / Critical interventions / Medication management  Patient presents to emergency room with complaint of abdominal pain.  She locates this in her right lower quadrant.  The pain comes and goes.  It does seem to be worse when she is sitting up and down on the exam table here.  It is reproducible on exam.  Denies any vaginal discharge and declines testing for STDs or wet prep.  Her urine shows no UTI.  Her labs are overall unremarkable.  Her CT scan shows no acute findings. I suspect this is either musculoskeletal in nature given that it is worse with movement or it is unrelated to history of uterine fibroids. I ordered medication including Morphine , zofran  After receiving both of these medication started developing itchy skin and hives.  She was given 50 of Benadryl  with improvement of symptoms. Reevaluation of the patient after these medicines showed that the patient improved I have reviewed the patients home medicines and have made adjustments as needed. Patient has overall reassuring workup here.  Feel appropriate for discharge with outpatient follow-up.  Would recommend ice, heat, anti-inflammatories and recheck of symptoms primary care in 2 to 3 days.  Given return precautions.      Final diagnoses:  RLQ abdominal pain    ED Discharge Orders          Ordered    dicyclomine  (BENTYL ) 20 MG tablet  2 times daily        03/09/24 1650    naproxen  (NAPROSYN ) 500 MG tablet  2 times daily        03/09/24 1650               Hattie Aguinaldo, Warren SAILOR, PA-C 03/09/24 1657    Rogelia Jerilynn RAMAN, MD 03/14/24 [phone]

## 2024-03-09 NOTE — ED Notes (Addendum)
 Pt rang call bell to notify RN of new onset allergic reaction sp IV medication administration. Hives noted to right arm distal to IV site. Airway patent. No acute distress noted. Pt denies shortness of breath. Provider notified.

## 2024-03-09 NOTE — Discharge Instructions (Addendum)
 I would recommend recheck of symptoms with primary care in 2 to 3 days.  In the meantime I would recommend ice, heat and naproxen .  You can also take Bentyl  which helps with abdominal cramping. Make sure you are staying well-hydrated. If itching and rash return take Benadryl  which is available over-the-counter.  If you start having shortness of breath, facial swelling come back to ED. Overall your CT scan is reassuring.  You have a normal-appearing appendix.  You also have reduced size from fibroids.  Return to ER with new or worsening symptoms.

## 2024-04-08 ENCOUNTER — Other Ambulatory Visit: Payer: Self-pay

## 2024-04-08 ENCOUNTER — Emergency Department (HOSPITAL_COMMUNITY)

## 2024-04-08 ENCOUNTER — Emergency Department (HOSPITAL_COMMUNITY): Payer: Worker's Compensation

## 2024-04-08 ENCOUNTER — Emergency Department (HOSPITAL_COMMUNITY)
Admission: EM | Admit: 2024-04-08 | Discharge: 2024-04-08 | Disposition: A | Discharge status: Home / Self Care | Payer: Worker's Compensation | Attending: Emergency Medicine | Admitting: Emergency Medicine

## 2024-04-08 DIAGNOSIS — Y9301 Activity, walking, marching and hiking: Secondary | ICD-10-CM | POA: Diagnosis not present

## 2024-04-08 DIAGNOSIS — W010XXA Fall on same level from slipping, tripping and stumbling without subsequent striking against object, initial encounter: Secondary | ICD-10-CM | POA: Insufficient documentation

## 2024-04-08 DIAGNOSIS — S82832A Other fracture of upper and lower end of left fibula, initial encounter for closed fracture: Secondary | ICD-10-CM | POA: Diagnosis not present

## 2024-04-08 DIAGNOSIS — M25572 Pain in left ankle and joints of left foot: Secondary | ICD-10-CM | POA: Diagnosis present

## 2024-04-08 DIAGNOSIS — I10 Essential (primary) hypertension: Secondary | ICD-10-CM | POA: Diagnosis not present

## 2024-04-08 DIAGNOSIS — Y990 Civilian activity done for income or pay: Secondary | ICD-10-CM | POA: Insufficient documentation

## 2024-04-08 DIAGNOSIS — E119 Type 2 diabetes mellitus without complications: Secondary | ICD-10-CM | POA: Insufficient documentation

## 2024-04-08 MED ORDER — FENTANYL CITRATE (PF) 50 MCG/ML IJ SOSY
100.0000 ug | PREFILLED_SYRINGE | Freq: Once | INTRAMUSCULAR | Status: AC
  Administered 2024-04-08: 100 ug via INTRAMUSCULAR

## 2024-04-08 MED ORDER — LIDOCAINE-EPINEPHRINE (PF) 2 %-1:200000 IJ SOLN
20.0000 mL | Freq: Once | INTRAMUSCULAR | Status: AC
  Administered 2024-04-08: 20 mL
  Filled 2024-04-08: qty 20

## 2024-04-08 MED ORDER — OXYCODONE HCL 5 MG PO TABS: 5.0000 mg | ORAL_TABLET | ORAL | 0 refills | Status: AC | PRN

## 2024-04-08 MED ORDER — FENTANYL CITRATE (PF) 50 MCG/ML IJ SOSY
100.0000 ug | PREFILLED_SYRINGE | Freq: Once | INTRAMUSCULAR | Status: DC
  Filled 2024-04-08: qty 2

## 2024-04-08 NOTE — ED Notes (Signed)
 Unsuccessful IV attempt x2.

## 2024-04-08 NOTE — Progress Notes (Signed)
 Patient discussed with ER physician's assistant. 43 year old female with left closed bimalleolar equivalent ankle fracture.  She warrants reduction and splinting and then can follow up with me in my clinic this week for surgical discussion. Recommend non weightbearing to the LLE in posterior slab splint with stirrups.  Elevate and ice the extremity to decrease swelling.  Rankin Pizza, MD

## 2024-04-08 NOTE — Progress Notes (Signed)
 Orthopedic Tech Progress Note Patient Details:  Adrienne Roberts 10-Nov-1980 [id number]  Ortho Devices Type of Ortho Device: Post (short leg) splint, Stirrup splint Ortho Device/Splint Location: lle Ortho Device/Splint Interventions: Ordered, Application, Adjustment   Post Interventions Patient Tolerated: Well Instructions Provided: Care of device, Adjustment of device  Chandra Dorn PARAS 04/08/2024, 11:15 PM

## 2024-04-08 NOTE — ED Provider Notes (Signed)
 [place] [facility]MENT AT [facility] Provider Note   CSN: [id number] Arrival date & time: 04/08/24  1943     Patient presents with: Ankle Pain   Adrienne Roberts is a 43 y.o. female.   43 year old female presents today because she slipped and fell at work and has significant left ankle pain.  She states initially it was deformed and she popped back into place.  She did hear a pop when she fell.  She was walking out of the room and tripped over a box that she forgot she placed there.  She has a video that she shows me.  No head injury or loss of consciousness.  The history is provided by the patient. No language interpreter was used.       Prior to Admission medications   Medication Sig Start Date End Date Taking? Authorizing Provider  dicyclomine  (BENTYL ) 20 MG tablet Take 1 tablet (20 mg total) by mouth 2 (two) times daily. 03/09/24   Barrett, Warren SAILOR, PA-C  naproxen  (NAPROSYN ) 500 MG tablet Take 1 tablet (500 mg total) by mouth 2 (two) times daily. 03/09/24   Barrett, Warren SAILOR, PA-C  omeprazole  (PRILOSEC) 20 MG capsule Take 1 capsule (20 mg total) by mouth daily. 06/05/20 07/05/20  Sponseller, Pleasant JONELLE, PA-C    Allergies: Morphine , Prednisone, Zofran  [ondansetron ], Lisinopril, and Mobic  [meloxicam ]    Review of Systems  Constitutional:  Negative for chills and fever.  Respiratory:  Negative for shortness of breath.   Gastrointestinal:  Negative for abdominal pain.  Neurological:  Negative for light-headedness.  All other systems reviewed and are negative.   Updated Vital Signs BP (!) 163/84   Pulse 73   Temp 98.2 F (36.8 C) (Oral)   Resp 17   LMP 02/18/2024 (Approximate)   SpO2 100%   Physical Exam Vitals and nursing note reviewed.  Constitutional:      General: She is not in acute distress.    Appearance: Normal appearance. She is not ill-appearing.  HENT:     Head: Normocephalic and atraumatic.     Nose: Nose normal.  Eyes:      Conjunctiva/sclera: Conjunctivae normal.  Pulmonary:     Effort: Pulmonary effort is normal. No respiratory distress.  Musculoskeletal:        General: No deformity.     Comments: Swelling around the left ankle.  Tib-fib without tenderness to palpation.  Left ankle without tenderness palpation ankle range of motion.  Neurovascularly intact.  Limited dorsiflexion due to pain.  Tenderness over the medial and lateral malleolus.  Skin:    Findings: No rash.  Neurological:     Mental Status: She is alert.     (all labs ordered are listed, but only abnormal results are displayed) Labs Reviewed - No data to display  EKG: None  Radiology: No results found.   [facility] Injury Treatment  Date/Time: 04/08/2024 11:17 PM  Performed by: Hildegard Loge, PA-C Authorized by: Hildegard Loge, PA-C   Consent:    Consent obtained:  Verbal   Consent given by:  Patient   Risks discussed:  Fracture, nerve damage, restricted joint movement, vascular damage, irreducible dislocation, recurrent dislocation and stiffness   Alternatives discussed:  No treatment and immobilizationInjury location: ankle Location details: left ankle Injury type: fracture-dislocation (Distal fibular fracture with lateral talar displacement) Fracture type: lateral malleolus Pre-procedure neurovascular assessment: neurovascularly intact Pre-procedure distal perfusion: normal Pre-procedure neurological function: normal Pre-procedure range of motion: reduced Anesthesia: hematoma block  Anesthesia: Local  anesthesia used: yes Local Anesthetic: lidocaine  2% with epinephrine Anesthetic total: 12 mL  Patient sedated: NoImmobilization: splint and crutches Splint Applied by: Ortho Tech Supplies used: Ortho-Glass Post-procedure neurovascular assessment: post-procedure neurovascularly intact Post-procedure distal perfusion: normal Post-procedure neurological function: normal Post-procedure range of motion: unchanged Post-procedure  range of motion comment: Reduced due to splint      Medications Ordered in the ED - No data to display  Clinical Course as of 04/08/24 2310  Sun Apr 08, 2024  2120 Spoke with Dr. Teresa with Ortho.  He does recommend reducing given the talar shift. [AA]    Clinical Course User Index [AA] Hildegard Loge, PA-C                                 Medical Decision Making Amount and/or Complexity of Data Reviewed Radiology: ordered.  Risk Prescription drug management.   Medical Decision Making / ED Course   This patient presents to the ED for concern of fall, this involves an extensive number of treatment options, and is a complaint that carries with it a high risk of complications and morbidity.  The differential diagnosis includes fracture, dislocation, contusion  MDM: 43 year old female presents today after falling at work.  Complaints of left ankle swelling and pain.  X-ray shows left distal fibular fracture along with lateral talar displacement.  Discussed with Dr. Brien who recommends attempting to push the tunnel back into place while splinting.  States should not require any significant force since is not significant displacement.  Done with hematoma block.  See procedure above. Could not improve the, displacement however the fibular fracture was better aligned after. After the initial x-ray which showed and I noticed it was not significantly improved.  I did apply pressure to bring the talus back medially however on repeat x-ray it did not show any improvement.  Discussed close follow-up. Medication prescribed. Patient is in agreement with plan.  Discharged in stable condition.    Lab Tests: -I ordered, reviewed, and interpreted labs.   The pertinent results include:   Labs Reviewed - No data to display    EKG  EKG Interpretation Date/Time:    Ventricular Rate:    PR Interval:    QRS Duration:    QT Interval:    QTC Calculation:   R Axis:      Text  Interpretation:           Imaging Studies ordered: I ordered imaging studies including left ankle x-rays I independently visualized and interpreted imaging. I agree with the radiologist interpretation   Medicines ordered and prescription drug management: Meds ordered this encounter  Medications   lidocaine -EPINEPHrine (XYLOCAINE  W/EPI) 2 %-1:200000 (PF) injection 20 mL   DISCONTD: fentaNYL (SUBLIMAZE) injection 100 mcg   fentaNYL (SUBLIMAZE) injection 100 mcg   oxyCODONE (ROXICODONE) 5 MG immediate release tablet    Sig: Take 1 tablet (5 mg total) by mouth every 4 (four) hours as needed for severe pain (pain score 7-10).    Dispense:  25 tablet    Refill:  0    Supervising Provider:   CLEOTILDE ROGUE [[id number]]    -I have reviewed the patients home medicines and have made adjustments as needed  Reevaluation: After the interventions noted above, I reevaluated the patient and found that they have :improved  Co morbidities that complicate the patient evaluation  Past Medical History:  Diagnosis Date   Arthritis  Diabetes mellitus without complication (HCC)    Fibroids    uterine   Hypertension    MRSA (methicillin resistant Staphylococcus aureus)       Dispostion: Discharged in stable condition.  Return precaution discussed.  Patient voices understanding and is in agreement with plan.  Final diagnoses:  Closed fracture of distal end of left fibula, unspecified fracture morphology, initial encounter    ED Discharge Orders          Ordered    oxyCODONE (ROXICODONE) 5 MG immediate release tablet  Every 4 hours PRN        04/08/24 2309               Hildegard Loge, PA-C 04/08/24 2324    Franklyn Sid SAILOR, MD 04/09/24 [phone]

## 2024-04-08 NOTE — Discharge Instructions (Signed)
 Please call Dr. Joleen office tomorrow morning to schedule a follow-up appointment.  I have sent pain medication to the pharmacy for you.  This will make you drowsy, do not drive or do anything else dangerous after taking this medication.  Take Tylenol  1000 mg every 6 hours, ibuprofen  600 mg every 6 hours.  For breakthrough and severe pain take the pain medication that I have sent into the pharmacy.  Follow-up with the surgeon.  Return for any emergent symptoms.

## 2024-04-08 NOTE — ED Triage Notes (Signed)
 BIBA - slipped and fell at work- no head strike or LOC. Pt has c/o left ankle pain and swelling, states she heard a pop.  150/82 bp 82 hr 20 rr 97% r/a 147 cbg

## 2024-04-19 ENCOUNTER — Encounter (HOSPITAL_BASED_OUTPATIENT_CLINIC_OR_DEPARTMENT_OTHER): Payer: Self-pay | Admitting: Emergency Medicine

## 2024-04-19 ENCOUNTER — Other Ambulatory Visit: Payer: Self-pay

## 2024-04-19 ENCOUNTER — Emergency Department (HOSPITAL_BASED_OUTPATIENT_CLINIC_OR_DEPARTMENT_OTHER)
Admission: EM | Admit: 2024-04-19 | Discharge: 2024-04-19 | Disposition: A | Discharge status: Home / Self Care | Attending: Emergency Medicine | Admitting: Emergency Medicine

## 2024-04-19 DIAGNOSIS — M79672 Pain in left foot: Secondary | ICD-10-CM | POA: Diagnosis present

## 2024-04-19 DIAGNOSIS — E119 Type 2 diabetes mellitus without complications: Secondary | ICD-10-CM | POA: Diagnosis not present

## 2024-04-19 DIAGNOSIS — I10 Essential (primary) hypertension: Secondary | ICD-10-CM | POA: Insufficient documentation

## 2024-04-19 NOTE — ED Notes (Signed)
 Ace wraps applied per EDP orders.  Existing ortho cast that was placed at surgical center was secured with same.  Pt notes relief to the area.  Ice pack remains in place at this time

## 2024-04-19 NOTE — ED Notes (Signed)
 Good PMS noted to exposed toes.  Pt notes that she can feel her foot sliding in the cast.  Toes are mildly swollen no new discoloration.  Ice pack was applied at this time to assist in the swelling.  Pt is emotional and highly worried about this condition.  Will continue to monitor

## 2024-04-19 NOTE — Discharge Instructions (Signed)
 Please see your orthopedic surgeon to consider reapplying the cast/splint.

## 2024-04-19 NOTE — ED Provider Notes (Signed)
 [place] AT [facility] Provider Note   CSN: [id number] Arrival date & time: 04/19/24  9671     Patient presents with: Leg Swelling   Adrienne Roberts is a 43 y.o. female.   The history is provided by the patient.   She has history of hypertension, diabetes and comes in because of pain in her left foot which she thinks is because her cast is too tight.  She had an left ankle fracture which was treated with open reduction internal fixation 2 days ago, but she is afraid that her foot may have slipped in the cast and the cast is too tight and she is having pain in her foot distal to the cast.  She has also noted some swelling.    Prior to Admission medications   Medication Sig Start Date End Date Taking? Authorizing Provider  dicyclomine  (BENTYL ) 20 MG tablet Take 1 tablet (20 mg total) by mouth 2 (two) times daily. 03/09/24   Barrett, Jamie N, PA-C  naproxen  (NAPROSYN ) 500 MG tablet Take 1 tablet (500 mg total) by mouth 2 (two) times daily. 03/09/24   Barrett, Warren SAILOR, PA-C  omeprazole  (PRILOSEC) 20 MG capsule Take 1 capsule (20 mg total) by mouth daily. 06/05/20 07/05/20  Sponseller, Rebekah R, PA-C  oxyCODONE (ROXICODONE) 5 MG immediate release tablet Take 1 tablet (5 mg total) by mouth every 4 (four) hours as needed for severe pain (pain score 7-10). 04/08/24   Hildegard Loge, PA-C    Allergies: Morphine , Prednisone, Zofran  [ondansetron ], Lisinopril, and Mobic  [meloxicam ]    Review of Systems  All other systems reviewed and are negative.   Updated Vital Signs BP (!) 139/94 (BP Location: Right Arm)   Pulse 99   Temp 98.2 F (36.8 C) (Oral)   Resp 20   Ht 5' 8 (1.727 m)   Wt 108.9 kg   LMP 04/13/2024   SpO2 99%   BMI 36.49 kg/m   Physical Exam Vitals and nursing note reviewed.   43 year old female, resting comfortably and in no acute distress. Vital signs are significant for mildly elevated blood pressure. Oxygen saturation is 99%, which is  normal.  Extremities: Left lower leg has a posterior and stirrup splint in place.  There is soft tissue swelling distal to the splint with some ecchymosis noted but sensation is intact. Skin is warm and dry without rash. Neurologic: Awake and alert, moves all extremities equally.    Procedures   Medications Ordered in the ED - No data to display                                  Medical Decision Making  Left foot pain following cast placement following open reduction internal fixation of left ankle fracture.  I reviewed her past records, and note ED visit on 04/08/2024 for fracture of distal left fibula.  Surgery was done at [facility] surgical center, and notes from procedure there are not visible and EPIC.  I removed the Coban which was being used to hold the splint in place and cut the cotton padding underneath the cast.  The cast itself was plaster and seemed to be in appropriate position.  Following cutting through the cotton, patient states that she is feeling better, but still feels that the cast is too tight on her foot.  It is noted that there is not any padding over the edge of  the plaster going over the ball of her foot and some additional padding is placed there.  The splint was then wrapped with an elastic bandage and I am discharging her with instructions to follow-up with her foot/ankle surgeon.     Final diagnoses:  Pain in left foot    ED Discharge Orders     None          Raford Lenis, MD 04/19/24 [phone]

## 2024-04-19 NOTE — ED Triage Notes (Signed)
 Pt injured at work on 10/26 seen for same, Sx on tues, states woke up with sever pain and swelling.
